# Patient Record
Sex: Female | Born: 1945
Health system: Southern US, Community
[De-identification: ages and names within clinical notes are randomized; demographics above are authoritative.]

## PROBLEM LIST (undated history)

## (undated) DIAGNOSIS — M359 Systemic involvement of connective tissue, unspecified: Secondary | ICD-10-CM

## (undated) DIAGNOSIS — M810 Age-related osteoporosis without current pathological fracture: Secondary | ICD-10-CM

## (undated) DIAGNOSIS — M1990 Unspecified osteoarthritis, unspecified site: Secondary | ICD-10-CM

## (undated) DIAGNOSIS — M7580 Other shoulder lesions, unspecified shoulder: Secondary | ICD-10-CM

## (undated) DIAGNOSIS — M419 Scoliosis, unspecified: Secondary | ICD-10-CM

## (undated) DIAGNOSIS — G8929 Other chronic pain: Secondary | ICD-10-CM

## (undated) DIAGNOSIS — M719 Bursopathy, unspecified: Secondary | ICD-10-CM

## (undated) DIAGNOSIS — M3500 Sicca syndrome, unspecified: Secondary | ICD-10-CM

## (undated) DIAGNOSIS — G35 Multiple sclerosis: Secondary | ICD-10-CM

## (undated) DIAGNOSIS — M329 Systemic lupus erythematosus, unspecified: Secondary | ICD-10-CM

## (undated) DIAGNOSIS — E042 Nontoxic multinodular goiter: Secondary | ICD-10-CM

## (undated) DIAGNOSIS — E7800 Pure hypercholesterolemia, unspecified: Secondary | ICD-10-CM

## (undated) DIAGNOSIS — S83209A Unspecified tear of unspecified meniscus, current injury, unspecified knee, initial encounter: Secondary | ICD-10-CM

## (undated) DIAGNOSIS — R112 Nausea with vomiting, unspecified: Secondary | ICD-10-CM

## (undated) DIAGNOSIS — IMO0002 Reserved for concepts with insufficient information to code with codable children: Secondary | ICD-10-CM

## (undated) DIAGNOSIS — I10 Essential (primary) hypertension: Secondary | ICD-10-CM

## (undated) DIAGNOSIS — C4490 Unspecified malignant neoplasm of skin, unspecified: Secondary | ICD-10-CM

## (undated) DIAGNOSIS — Z9889 Other specified postprocedural states: Secondary | ICD-10-CM

## (undated) HISTORY — DX: Unspecified osteoarthritis, unspecified site: M19.90

## (undated) HISTORY — DX: Sicca syndrome, unspecified: M35.00

## (undated) HISTORY — PX: TONSILLECTOMY: SUR1361

## (undated) HISTORY — DX: Systemic involvement of connective tissue, unspecified: M35.9

## (undated) HISTORY — DX: Age-related osteoporosis without current pathological fracture: M81.0

## (undated) HISTORY — DX: Pure hypercholesterolemia, unspecified: E78.00

## (undated) HISTORY — DX: Other chronic pain: G89.29

## (undated) HISTORY — DX: Essential (primary) hypertension: I10

## (undated) HISTORY — PX: DILATION AND CURETTAGE OF UTERUS: SHX78

## (undated) HISTORY — DX: Unspecified tear of unspecified meniscus, current injury, unspecified knee, initial encounter: S83.209A

## (undated) HISTORY — PX: OTHER SURGICAL HISTORY: SHX169

## (undated) HISTORY — DX: Scoliosis, unspecified: M41.9

## (undated) HISTORY — PX: SKIN CANCER EXCISION: SHX779

---

## 1999-03-30 ENCOUNTER — Ambulatory Visit (HOSPITAL_COMMUNITY): Admission: RE | Admit: 1999-03-30 | Discharge: 1999-03-30 | Payer: Self-pay | Admitting: Neurology

## 1999-07-17 ENCOUNTER — Other Ambulatory Visit: Admission: RE | Admit: 1999-07-17 | Discharge: 1999-07-17 | Payer: Self-pay | Admitting: Obstetrics and Gynecology

## 2000-01-12 ENCOUNTER — Encounter (INDEPENDENT_AMBULATORY_CARE_PROVIDER_SITE_OTHER): Payer: Self-pay

## 2000-01-12 ENCOUNTER — Ambulatory Visit (HOSPITAL_COMMUNITY): Admission: RE | Admit: 2000-01-12 | Discharge: 2000-01-12 | Payer: Self-pay | Admitting: Obstetrics & Gynecology

## 2000-03-07 ENCOUNTER — Encounter: Payer: Self-pay | Admitting: Obstetrics and Gynecology

## 2000-03-07 ENCOUNTER — Ambulatory Visit (HOSPITAL_COMMUNITY): Admission: RE | Admit: 2000-03-07 | Discharge: 2000-03-07 | Payer: Self-pay | Admitting: Obstetrics and Gynecology

## 2001-03-24 ENCOUNTER — Other Ambulatory Visit: Admission: RE | Admit: 2001-03-24 | Discharge: 2001-03-24 | Payer: Self-pay | Admitting: Obstetrics and Gynecology

## 2002-07-02 ENCOUNTER — Other Ambulatory Visit: Admission: RE | Admit: 2002-07-02 | Discharge: 2002-07-02 | Payer: Self-pay | Admitting: Obstetrics and Gynecology

## 2002-07-06 ENCOUNTER — Encounter: Admission: RE | Admit: 2002-07-06 | Discharge: 2002-07-06 | Payer: Self-pay | Admitting: Obstetrics and Gynecology

## 2002-07-06 ENCOUNTER — Encounter: Payer: Self-pay | Admitting: Obstetrics and Gynecology

## 2002-07-20 ENCOUNTER — Ambulatory Visit (HOSPITAL_COMMUNITY): Admission: RE | Admit: 2002-07-20 | Discharge: 2002-07-20 | Payer: Self-pay | Admitting: Neurology

## 2002-07-20 ENCOUNTER — Encounter: Payer: Self-pay | Admitting: Neurology

## 2002-07-22 ENCOUNTER — Ambulatory Visit (HOSPITAL_COMMUNITY): Admission: RE | Admit: 2002-07-22 | Discharge: 2002-07-22 | Payer: Self-pay | Admitting: Neurology

## 2002-07-22 ENCOUNTER — Encounter: Payer: Self-pay | Admitting: Neurology

## 2003-01-25 ENCOUNTER — Ambulatory Visit (HOSPITAL_COMMUNITY): Admission: RE | Admit: 2003-01-25 | Discharge: 2003-01-25 | Payer: Self-pay | Admitting: Gastroenterology

## 2003-10-20 ENCOUNTER — Other Ambulatory Visit: Admission: RE | Admit: 2003-10-20 | Discharge: 2003-10-20 | Payer: Self-pay | Admitting: Obstetrics and Gynecology

## 2004-11-07 ENCOUNTER — Other Ambulatory Visit: Admission: RE | Admit: 2004-11-07 | Discharge: 2004-11-07 | Payer: Self-pay | Admitting: Obstetrics and Gynecology

## 2005-12-20 ENCOUNTER — Other Ambulatory Visit: Admission: RE | Admit: 2005-12-20 | Discharge: 2005-12-20 | Payer: Self-pay | Admitting: Obstetrics and Gynecology

## 2008-02-23 ENCOUNTER — Emergency Department (HOSPITAL_COMMUNITY): Admission: EM | Admit: 2008-02-23 | Discharge: 2008-02-23 | Payer: Self-pay | Admitting: Family Medicine

## 2008-04-09 ENCOUNTER — Emergency Department (HOSPITAL_COMMUNITY): Admission: EM | Admit: 2008-04-09 | Discharge: 2008-04-10 | Payer: Self-pay | Admitting: Emergency Medicine
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
## 2009-09-30 ENCOUNTER — Encounter: Admission: RE | Admit: 2009-09-30 | Discharge: 2009-09-30 | Payer: Self-pay | Admitting: Family Medicine

## 2010-12-17 ENCOUNTER — Encounter: Payer: Self-pay | Admitting: Family Medicine

## 2011-04-13 NOTE — Op Note (Signed)
[facility]  Patient:    Michele Walton, Michele Walton                     MRN: [id number] Proc. Date: 01/12/00 Adm. Date:  [id number] Disc. Date: [id number] Attending:  Lars Pinks                           Operative Report  PREOPERATIVE DIAGNOSIS:       Postmenopausal bleeding on estrogen-replacement therapy.  POSTOPERATIVE DIAGNOSIS:      Postmenopausal bleeding on estrogen-replacement therapy.  PROCEDURE:                    1. Hysteroscopy.                               2. Dilatation and curettage.  SURGEON:                      Mark E. Dareen Piano, M.D.  ANESTHESIA:                   MAC with paracervical block.  DRAINS:                       Red rubber catheter to bladder.  ANTIBIOTICS:                  Ancef 1 g.  COMPLICATIONS:                None.  SPECIMENS:                    Endometrial and endocervical curettings, sent to pathology.  DESCRIPTION OF PROCEDURE:     Patient was taken to the operating room where she was placed in the dorsal supine position and MAC was administered.  She was placed n a dorsal lithotomy position and prepped with Hibiclens.  Her bladder was draped with a red rubber catheter.  A sterile speculum was placed in the vagina.  Twenty cc of 1% lidocaine were used for paracervical block.  Single-tooth tenaculum was applied to the anterior cervical lip.  The uterus was then sounded to 7 cm.  The cervical os was then dilated to a 27-French.  The hysteroscope was then advanced.  The endocervical canal appeared to be normal.  There was no evidence of any polyps r lesions.  On entering the uterine cavity, both ostia were visualized with ease.  There was no evidence of leiomyomata, polyps or lesions.  At this point, the scope was removed.  Endocervical curettage was performed, followed by an endometrial curettage.  Patient tolerated the procedure well.  She was taken to the recovery room in stable condition.   Instrument and lap counts were correct x 1. DD:  01/12/00 TD:  01/14/00 Job: [id number]

## 2011-04-13 NOTE — Op Note (Signed)
NAME:  Michele Walton, Michele Walton                        ACCOUNT NO.:  [id number]   MEDICAL RECORD NO.:  [id number]                   PATIENT TYPE:  AMB   LOCATION:  ENDO                                 FACILITY:  [facility]   PHYSICIAN:  Anselmo Rod, M.D.               DATE OF BIRTH:  1946/06/12   DATE OF PROCEDURE:  01/25/2003  DATE OF DISCHARGE:                                 OPERATIVE REPORT   PROCEDURE PERFORMED:  Screening colonoscopy.   ENDOSCOPIST:  Charna Elizabeth, M.D.   INSTRUMENT USED:  Olympus video colonoscope.   INDICATIONS FOR PROCEDURE:  The patient is a 65 year old white female  undergoing screening colonoscopy.  The patient has a history of abdominal  pain and chronic constipation.  Rule out colonic polyps, masses, etc.   PREPROCEDURE PREPARATION:  Informed consent was procured from the patient.  The patient was fasted for eight hours prior to the procedure and prepped  with a bottle of MiraLax and Gatorade the night prior to the procedure.   PREPROCEDURE PHYSICAL:  The patient had stable vital signs.  Neck supple.  Chest clear to auscultation.  S1 and S2 regular.  Abdomen soft with normal  bowel sounds.   DESCRIPTION OF PROCEDURE:  The patient was placed in left lateral decubitus  position and sedated with 50 mg of Demerol and 5 mg of Versed intravenously.  Once the patient was adequately sedated and maintained on low flow oxygen  and continuous cardiac monitoring, the Olympus video colonoscope was  advanced from the rectum to the cecum and terminal ileum without difficulty.  No masses, polyps, erosions, ulcerations or diverticula were seen.  Small  internal hemorrhoids were recognized on retroflexion in the rectum.  The  terminal ileum appeared healthy without lesions.   IMPRESSION:  1. Normal colonoscopy up to the terminal ileum except for small internal     hemorrhoids.  2. No masses polyps seen, no evidence of diverticulosis.   RECOMMENDATIONS:  1. A high fiber  diet with liberal fluid intake has been advocated.  20-25     grams of fiber has been recommended in her diet.  2.     Repeat colorectal cancer screening recommended in the next five to 10 years      unless the patient develops any abnormal symptoms in the interim.  3. Outpatient follow-up in the next two weeks, earlier if need be.                                                   Anselmo Rod, M.D.    JNM/MEDQ  D:  01/25/2003  T:  01/25/2003  Job:  [id number]   cc:   Duwayne Heck L. Mahaffey, M.D.  [address]  [place]  [place] [address]  Fax:  [phone]  

## 2011-08-03 ENCOUNTER — Other Ambulatory Visit: Payer: Self-pay | Admitting: Orthopedic Surgery

## 2011-08-03 DIAGNOSIS — Z780 Asymptomatic menopausal state: Secondary | ICD-10-CM

## 2011-08-07 ENCOUNTER — Other Ambulatory Visit: Payer: Self-pay | Admitting: Family Medicine

## 2011-08-07 DIAGNOSIS — E041 Nontoxic single thyroid nodule: Secondary | ICD-10-CM

## 2011-08-09 ENCOUNTER — Ambulatory Visit
Admission: RE | Admit: 2011-08-09 | Discharge: 2011-08-09 | Disposition: A | Discharge status: Home / Self Care | Payer: Medicare Other | Source: Ambulatory Visit | Attending: Orthopedic Surgery | Admitting: Orthopedic Surgery

## 2011-08-09 DIAGNOSIS — Z780 Asymptomatic menopausal state: Secondary | ICD-10-CM

## 2011-08-10 ENCOUNTER — Ambulatory Visit
Admission: RE | Admit: 2011-08-10 | Discharge: 2011-08-10 | Disposition: A | Discharge status: Home / Self Care | Payer: Medicare Other | Source: Ambulatory Visit | Attending: Family Medicine | Admitting: Family Medicine

## 2011-08-10 DIAGNOSIS — E041 Nontoxic single thyroid nodule: Secondary | ICD-10-CM

## 2011-12-03 DIAGNOSIS — Z79899 Other long term (current) drug therapy: Secondary | ICD-10-CM | POA: Diagnosis not present

## 2011-12-03 DIAGNOSIS — E7800 Pure hypercholesterolemia, unspecified: Secondary | ICD-10-CM | POA: Diagnosis not present

## 2011-12-03 DIAGNOSIS — I10 Essential (primary) hypertension: Secondary | ICD-10-CM | POA: Diagnosis not present

## 2012-02-01 DIAGNOSIS — N3941 Urge incontinence: Secondary | ICD-10-CM | POA: Diagnosis not present

## 2012-02-01 DIAGNOSIS — N318 Other neuromuscular dysfunction of bladder: Secondary | ICD-10-CM | POA: Diagnosis not present

## 2012-02-01 DIAGNOSIS — R3915 Urgency of urination: Secondary | ICD-10-CM | POA: Diagnosis not present

## 2012-03-18 DIAGNOSIS — L080 Pyoderma: Secondary | ICD-10-CM | POA: Diagnosis not present

## 2012-03-18 DIAGNOSIS — B958 Unspecified staphylococcus as the cause of diseases classified elsewhere: Secondary | ICD-10-CM | POA: Diagnosis not present

## 2012-04-02 DIAGNOSIS — Z124 Encounter for screening for malignant neoplasm of cervix: Secondary | ICD-10-CM | POA: Diagnosis not present

## 2012-04-02 DIAGNOSIS — Z1231 Encounter for screening mammogram for malignant neoplasm of breast: Secondary | ICD-10-CM | POA: Diagnosis not present

## 2012-04-16 DIAGNOSIS — G35 Multiple sclerosis: Secondary | ICD-10-CM | POA: Diagnosis not present

## 2012-04-16 DIAGNOSIS — H4720 Unspecified optic atrophy: Secondary | ICD-10-CM | POA: Diagnosis not present

## 2012-06-02 DIAGNOSIS — G35 Multiple sclerosis: Secondary | ICD-10-CM | POA: Diagnosis not present

## 2012-06-02 DIAGNOSIS — M549 Dorsalgia, unspecified: Secondary | ICD-10-CM | POA: Diagnosis not present

## 2012-06-04 DIAGNOSIS — I10 Essential (primary) hypertension: Secondary | ICD-10-CM | POA: Diagnosis not present

## 2012-06-04 DIAGNOSIS — Z79899 Other long term (current) drug therapy: Secondary | ICD-10-CM | POA: Diagnosis not present

## 2012-06-04 DIAGNOSIS — E7800 Pure hypercholesterolemia, unspecified: Secondary | ICD-10-CM | POA: Diagnosis not present

## 2012-06-04 DIAGNOSIS — Z119 Encounter for screening for infectious and parasitic diseases, unspecified: Secondary | ICD-10-CM | POA: Diagnosis not present

## 2012-07-22 DIAGNOSIS — L570 Actinic keratosis: Secondary | ICD-10-CM | POA: Diagnosis not present

## 2012-07-22 DIAGNOSIS — L819 Disorder of pigmentation, unspecified: Secondary | ICD-10-CM | POA: Diagnosis not present

## 2012-07-22 DIAGNOSIS — L930 Discoid lupus erythematosus: Secondary | ICD-10-CM | POA: Diagnosis not present

## 2012-07-22 DIAGNOSIS — D485 Neoplasm of uncertain behavior of skin: Secondary | ICD-10-CM | POA: Diagnosis not present

## 2012-08-07 DIAGNOSIS — IMO0002 Reserved for concepts with insufficient information to code with codable children: Secondary | ICD-10-CM | POA: Diagnosis not present

## 2012-08-13 DIAGNOSIS — IMO0002 Reserved for concepts with insufficient information to code with codable children: Secondary | ICD-10-CM | POA: Diagnosis not present

## 2012-08-19 DIAGNOSIS — L989 Disorder of the skin and subcutaneous tissue, unspecified: Secondary | ICD-10-CM | POA: Insufficient documentation

## 2012-08-20 DIAGNOSIS — IMO0002 Reserved for concepts with insufficient information to code with codable children: Secondary | ICD-10-CM | POA: Diagnosis not present

## 2012-08-20 DIAGNOSIS — M1710 Unilateral primary osteoarthritis, unspecified knee: Secondary | ICD-10-CM | POA: Diagnosis not present

## 2012-09-02 DIAGNOSIS — E042 Nontoxic multinodular goiter: Secondary | ICD-10-CM | POA: Diagnosis not present

## 2012-09-04 DIAGNOSIS — E042 Nontoxic multinodular goiter: Secondary | ICD-10-CM | POA: Diagnosis not present

## 2012-11-26 DIAGNOSIS — S83209A Unspecified tear of unspecified meniscus, current injury, unspecified knee, initial encounter: Secondary | ICD-10-CM

## 2012-11-26 HISTORY — DX: Unspecified tear of unspecified meniscus, current injury, unspecified knee, initial encounter: S83.209A

## 2012-12-08 DIAGNOSIS — E7800 Pure hypercholesterolemia, unspecified: Secondary | ICD-10-CM | POA: Diagnosis not present

## 2012-12-08 DIAGNOSIS — I10 Essential (primary) hypertension: Secondary | ICD-10-CM | POA: Diagnosis not present

## 2012-12-08 DIAGNOSIS — Z79899 Other long term (current) drug therapy: Secondary | ICD-10-CM | POA: Diagnosis not present

## 2012-12-10 DIAGNOSIS — IMO0002 Reserved for concepts with insufficient information to code with codable children: Secondary | ICD-10-CM | POA: Diagnosis not present

## 2012-12-10 DIAGNOSIS — M2390 Unspecified internal derangement of unspecified knee: Secondary | ICD-10-CM | POA: Diagnosis not present

## 2012-12-10 DIAGNOSIS — M2240 Chondromalacia patellae, unspecified knee: Secondary | ICD-10-CM | POA: Diagnosis not present

## 2012-12-10 DIAGNOSIS — Y999 Unspecified external cause status: Secondary | ICD-10-CM | POA: Diagnosis not present

## 2012-12-10 DIAGNOSIS — S83289A Other tear of lateral meniscus, current injury, unspecified knee, initial encounter: Secondary | ICD-10-CM | POA: Diagnosis not present

## 2012-12-10 DIAGNOSIS — X58XXXA Exposure to other specified factors, initial encounter: Secondary | ICD-10-CM | POA: Diagnosis not present

## 2012-12-10 DIAGNOSIS — Y929 Unspecified place or not applicable: Secondary | ICD-10-CM | POA: Diagnosis not present

## 2012-12-10 DIAGNOSIS — Y939 Activity, unspecified: Secondary | ICD-10-CM | POA: Diagnosis not present

## 2012-12-26 DIAGNOSIS — L930 Discoid lupus erythematosus: Secondary | ICD-10-CM | POA: Diagnosis not present

## 2013-01-20 DIAGNOSIS — Z79899 Other long term (current) drug therapy: Secondary | ICD-10-CM | POA: Diagnosis not present

## 2013-01-20 DIAGNOSIS — L930 Discoid lupus erythematosus: Secondary | ICD-10-CM | POA: Diagnosis not present

## 2013-01-21 DIAGNOSIS — Z4889 Encounter for other specified surgical aftercare: Secondary | ICD-10-CM | POA: Diagnosis not present

## 2013-03-25 ENCOUNTER — Encounter (HOSPITAL_COMMUNITY): Payer: Self-pay

## 2013-03-25 ENCOUNTER — Emergency Department (INDEPENDENT_AMBULATORY_CARE_PROVIDER_SITE_OTHER)
Admission: EM | Admit: 2013-03-25 | Discharge: 2013-03-25 | Disposition: A | Discharge status: Home / Self Care | Payer: Medicare Other | Source: Home / Self Care | Attending: Emergency Medicine | Admitting: Emergency Medicine

## 2013-03-25 DIAGNOSIS — S0990XA Unspecified injury of head, initial encounter: Secondary | ICD-10-CM

## 2013-03-25 DIAGNOSIS — G35 Multiple sclerosis: Secondary | ICD-10-CM | POA: Insufficient documentation

## 2013-03-25 DIAGNOSIS — R58 Hemorrhage, not elsewhere classified: Secondary | ICD-10-CM

## 2013-03-25 DIAGNOSIS — I998 Other disorder of circulatory system: Secondary | ICD-10-CM | POA: Diagnosis not present

## 2013-03-25 HISTORY — DX: Other shoulder lesions, unspecified shoulder: M75.80

## 2013-03-25 HISTORY — DX: Systemic lupus erythematosus, unspecified: M32.9

## 2013-03-25 HISTORY — DX: Unspecified malignant neoplasm of skin, unspecified: C44.90

## 2013-03-25 HISTORY — DX: Multiple sclerosis: G35

## 2013-03-25 HISTORY — DX: Nontoxic multinodular goiter: E04.2

## 2013-03-25 HISTORY — DX: Bursopathy, unspecified: M71.9

## 2013-03-25 HISTORY — DX: Reserved for concepts with insufficient information to code with codable children: IMO0002

## 2013-03-25 HISTORY — DX: Unspecified osteoarthritis, unspecified site: M19.90

## 2013-03-25 NOTE — ED Provider Notes (Signed)
History     CSN: [id number]  Arrival date & time 03/25/13  1412   First MD Initiated Contact with Patient 03/25/13 1622      Chief Complaint  Patient presents with  . Head Injury    (Consider location/radiation/quality/duration/timing/severity/associated sxs/prior treatment) HPI Comments: Pt had surgery on L knee in January, sometimes knee is still weak and gives out on her; her orthopedist is aware.  Knee gave out on her yesterday, pt fell forward into corner of filing cabinet.  Initially had large "goose egg" on her forehead which flattened out, then pt noticed yesterday evening she was getting bruising on forehead and around eyes.  Forehead feels "tight and weird" otherwise pt not in pain.   Patient is a 67 y.o. female presenting with head injury. The history is provided by the patient.  Head Injury Location:  R parietal Time since incident:  24 days Mechanism of injury comment:  Knee gave out on her and she fell forward into corner of filing cabinet Pain details:    Quality:  Aching   Severity:  Mild Chronicity:  New Relieved by:  None tried Worsened by:  Nothing tried Ineffective treatments:  None tried Associated symptoms: no disorientation, no headaches, no loss of consciousness, no memory loss, no nausea, no neck pain and no vomiting     Past Medical History  Diagnosis Date  . Multiple sclerosis   . Degenerative joint disease   . Bursitis   . Osteoarthritis   . AC (acromioclavicular) joint bone spurs   . Lupus   . Goiter, nontoxic, multinodular   . Skin cancer     Past Surgical History  Procedure Laterality Date  . Knee surgery      History reviewed. No pertinent family history.  History  Substance Use Topics  . Smoking status: Never Smoker   . Smokeless tobacco: Not on file  . Alcohol Use: No    OB History   Grav Para Term Preterm Abortions TAB SAB Ect Mult Living                  Review of Systems  Constitutional: Negative for fatigue.    HENT: Negative for neck pain.   Gastrointestinal: Negative for nausea and vomiting.  Skin: Positive for color change.  Neurological: Negative for dizziness, loss of consciousness and headaches.  Psychiatric/Behavioral: Negative for memory loss and confusion.    Allergies  Sulfa antibiotics  Home Medications   Current Outpatient Rx  Name  Route  Sig  Dispense  Refill  . benazepril-hydrochlorthiazide (LOTENSIN HCT) 20-25 MG per tablet   Oral   Take 1 tablet by mouth daily.         Marland Kitchen gabapentin (NEURONTIN) 300 MG capsule   Oral   Take 300 mg by mouth 3 (three) times daily.         . hydroxychloroquine (PLAQUENIL) 200 MG tablet   Oral   Take by mouth daily.         . modafinil (PROVIGIL) 200 MG tablet   Oral   Take 200 mg by mouth daily.         . naproxen (NAPROSYN) 500 MG tablet   Oral   Take 500 mg by mouth 2 (two) times daily with a meal.           BP 130/77  Pulse 82  Temp(Src) 98.1 F (36.7 C) (Oral)  Resp 16  SpO2 99%  Physical Exam  Constitutional: She is oriented to person,  place, and time. She appears well-developed and well-nourished. No distress.  HENT:  Head: Normocephalic.    Right Ear: Tympanic membrane, external ear and ear canal normal.  Left Ear: Tympanic membrane, external ear and ear canal normal.  Eyes: EOM are normal. Pupils are equal, round, and reactive to light.  Neurological: She is alert and oriented to person, place, and time. Gait normal.  Skin: Skin is warm and intact. Ecchymosis noted.    ED Course  Procedures (including critical care time)  Labs Reviewed - No data to display No results found.   1. Minor head injury without loss of consciousness, initial encounter   2. Ecchymosis       MDM  Pt given concerning signs to watch for.          Cathlyn Parsons, NP 03/25/13 [phone]

## 2013-03-25 NOTE — ED Provider Notes (Signed)
Medical screening examination/treatment/ procedure(s) were performed by non-physician practitioner and as supervising physician I was immediately available for consultations/colaborattion.   [employer] [place],M.D.  Duwayne Heck de [place], MD 03/25/13 2107

## 2013-03-25 NOTE — ED Notes (Signed)
States yesterday, her knee gave out, caused her to fall, struck top of head on corner of file cabinet. Denies LOC. Had developed a knot on top of her head, and face felt tight. Noted bruising and swelling in her face , right > left. No nose bleed, no Battle sign either ear. Good EOM

## 2013-04-02 DIAGNOSIS — Z79899 Other long term (current) drug therapy: Secondary | ICD-10-CM | POA: Diagnosis not present

## 2013-04-02 DIAGNOSIS — L930 Discoid lupus erythematosus: Secondary | ICD-10-CM | POA: Diagnosis not present

## 2013-04-06 DIAGNOSIS — H538 Other visual disturbances: Secondary | ICD-10-CM | POA: Diagnosis not present

## 2013-04-06 DIAGNOSIS — H353190 Nonexudative age-related macular degeneration, unspecified eye, stage unspecified: Secondary | ICD-10-CM | POA: Diagnosis not present

## 2013-04-06 DIAGNOSIS — Z79899 Other long term (current) drug therapy: Secondary | ICD-10-CM | POA: Diagnosis not present

## 2013-04-06 DIAGNOSIS — H2510 Age-related nuclear cataract, unspecified eye: Secondary | ICD-10-CM | POA: Diagnosis not present

## 2013-04-15 DIAGNOSIS — Z79899 Other long term (current) drug therapy: Secondary | ICD-10-CM | POA: Diagnosis not present

## 2013-05-08 DIAGNOSIS — H04129 Dry eye syndrome of unspecified lacrimal gland: Secondary | ICD-10-CM | POA: Diagnosis not present

## 2013-05-25 ENCOUNTER — Telehealth: Payer: Self-pay | Admitting: Neurology

## 2013-05-25 MED ORDER — GABAPENTIN 300 MG PO CAPS: 300.0000 mg | ORAL_CAPSULE | Freq: Three times a day (TID) | ORAL | Status: DC

## 2013-05-25 NOTE — Telephone Encounter (Signed)
Former [facility] patient, has not been assigned new provider.  Auth refill via WID.  Notified Triage patient needs assignment and then patient will need to schedule appt.

## 2013-06-11 ENCOUNTER — Telehealth: Payer: Self-pay | Admitting: Neurology

## 2013-06-11 NOTE — Telephone Encounter (Signed)
Pt is needing her prescription refilled but states no one has assigned a Dr for her so that she can get this prescription. Pt would like for someone to call her and give her this information and get her apt  W/new assigned Dr. Lynford Humphrey

## 2013-06-12 DIAGNOSIS — M169 Osteoarthritis of hip, unspecified: Secondary | ICD-10-CM | POA: Diagnosis not present

## 2013-06-12 DIAGNOSIS — Z4889 Encounter for other specified surgical aftercare: Secondary | ICD-10-CM | POA: Diagnosis not present

## 2013-06-12 DIAGNOSIS — M25569 Pain in unspecified knee: Secondary | ICD-10-CM | POA: Diagnosis not present

## 2013-06-12 NOTE — Telephone Encounter (Signed)
I called pt and LMVM for her on home #, cell could not LM.  (VM not set up).  Appt with Dr. Marjory Lies 07-31-2013 at 0830.  Once confirmed then can call prescription for gabapentin.

## 2013-06-16 ENCOUNTER — Encounter: Payer: Self-pay | Admitting: Neurology

## 2013-06-16 ENCOUNTER — Ambulatory Visit (INDEPENDENT_AMBULATORY_CARE_PROVIDER_SITE_OTHER): Payer: Medicare Other | Admitting: Neurology

## 2013-06-16 ENCOUNTER — Telehealth: Payer: Self-pay | Admitting: *Deleted

## 2013-06-16 VITALS — BP 125/72 | HR 76 | Resp 18 | Ht 63.0 in | Wt 161.0 lb

## 2013-06-16 DIAGNOSIS — M4120 Other idiopathic scoliosis, site unspecified: Secondary | ICD-10-CM

## 2013-06-16 DIAGNOSIS — G35 Multiple sclerosis: Secondary | ICD-10-CM

## 2013-06-16 DIAGNOSIS — G35D Multiple sclerosis, unspecified: Secondary | ICD-10-CM

## 2013-06-16 MED ORDER — MODAFINIL 200 MG PO TABS: 200.0000 mg | ORAL_TABLET | Freq: Every day | ORAL | Status: DC

## 2013-06-16 MED ORDER — NAPROXEN 500 MG PO TABS: 500.0000 mg | ORAL_TABLET | Freq: Two times a day (BID) | ORAL | Status: DC

## 2013-06-16 MED ORDER — GABAPENTIN 300 MG PO CAPS: 300.0000 mg | ORAL_CAPSULE | Freq: Three times a day (TID) | ORAL | Status: DC

## 2013-06-16 NOTE — Progress Notes (Signed)
[facility]  Provider:  Dr Travus Oren Referring Provider: Hollice Espy, MD Primary Care Physician:  Hollice Espy, MD  Chief Complaint  Patient presents with  . Follow-up    formerly Dr Imagene Gurney patient, MS, meds refill, rm 10    HPI:  Michele Walton is a 67 y.o. female here as a revisit , yearly for presumed MS . The patient was treated and followed by Dr Sandria Manly. The patient is a 67 year old Caucasian, right-handed female. She had originally been evaluated by Dr. Maximino Greenland in November 1994 when she presented with sensory abnormalities in the thoracic 8 level on the left side as well as increased and more brisk deep tendon reflexes on the left. An MRI at that time showed plaques thought to be present and mass she saw other neurologists and had normal MRIs of the brain in followup she had a normal visual evoked potentials for the left eye but as he is as examination was negative for oligoclonal bands, the hallmark of multiple sclerosis. There was also a question of another autoimmune disease being involved since the patient's antinuclear antibody was positive at 1;80 was referred for rheumatology. She started on Plaquenil - She was prescribed  Betaseron by Dr. Kathrynn Ducking at [facility]- in 2000,  later  the same year Dr. Sandria Manly begun to follow her.   he  found that there were indeed some cord signals and he repeated this he is after testing which now documented two oligoclonal bands,  this is borderline at best . She was diagnosed with cutaneus lupus.    The patient never experienced any clinical relapsing and does raise the question of a primary progressive MS disease versus  another cause for her abnormal sensations and hyper reflexes . She also has seen her urologist for neurogenic bladder.  Brain MRIs over the year have remained normal,  she has never had in the sign she does not have cognitive deficits and normal on visual disturbance. There are is significant  degenerative change as described over her cervical spine and her upper thoracic spine there is some atrophy described also lower cord, which may represent an area and degeneration. Devics' disease or neuromyelitis optica antibody (NMO)  has been negative. She was referred to [facility] for her scoliosis  And was found to  present with a 37 curving,  she has not had a surgical procedure but it is very likely that her spinal degenerative disease contributes to her neurologic symptoms and signs. She continues to exercise and does water aerobic exercises.   She never had any visual optic nerve involvement or optic neuritis. The patient works as an Theatre stage manager.        Review of Systems: Out of a complete 14 system review, the patient complains of only the following symptoms, and all other reviewed systems are negative.  scoliosis,    History   Social History  . Marital Status: Single    Spouse Name: N/A    Number of Children: 2  . Years of Education: N/A   Occupational History  . plan administer     [employer]   Social History Main Topics  . Smoking status: Never Smoker   . Smokeless tobacco: Not on file  . Alcohol Use: No  . Drug Use: No  . Sexually Active: Not on file   Other Topics Concern  . Not on file   Social History Narrative  . No narrative on file    Family  History  Problem Relation Age of Onset  . Heart disease Mother   . Cancer Other     Past Medical History  Diagnosis Date  . Multiple sclerosis   . Degenerative joint disease   . Bursitis   . Osteoarthritis   . AC (acromioclavicular) joint bone spurs   . Lupus   . Goiter, nontoxic, multinodular   . Skin cancer   . Chronic pain   . High cholesterol   . Hypertension   . Scoliosis   . Arthritis   . Torn meniscus 1/14    left knee    Past Surgical History  Procedure Laterality Date  . Knee surgery    . Tonsillectomy    . Childbirth  [id number]    admissions     Current Outpatient Prescriptions  Medication Sig Dispense Refill  . benazepril-hydrochlorthiazide (LOTENSIN HCT) 20-25 MG per tablet Take 1 tablet by mouth daily.      . calcium carbonate 200 MG capsule Take 250 mg by mouth daily.      . Cyanocobalamin (VITAMIN B 12 PO) Take by mouth daily.      . fesoterodine (TOVIAZ) 4 MG TB24 Take 4 mg by mouth daily.      Marland Kitchen gabapentin (NEURONTIN) 300 MG capsule Take 1 capsule (300 mg total) by mouth 3 (three) times daily.  90 capsule  0  . hydroxychloroquine (PLAQUENIL) 200 MG tablet Take by mouth daily.      . modafinil (PROVIGIL) 200 MG tablet Take 200 mg by mouth daily.      . naproxen (NAPROSYN) 500 MG tablet Take 500 mg by mouth 2 (two) times daily with a meal.      . VITAMIN D, ERGOCALCIFEROL, PO Take by mouth daily.      . Vitamin Mixture (VITAMIN E COMPLETE PO) Take by mouth daily.       No current facility-administered medications for this visit.    Allergies as of 06/16/2013 - Review Complete 06/16/2013  Allergen Reaction Noted  . Latex  06/16/2013  . Percocet (oxycodone-acetaminophen)  06/16/2013  . Sulfa antibiotics  03/25/2013    Vitals: BP 125/72  Pulse 76  Resp 18  Ht 5\' 3"  (1.6 m)  Wt 161 lb (73.029 kg)  BMI 28.53 kg/m2 Last Weight:  Wt Readings from Last 1 Encounters:  06/16/13 161 lb (73.029 kg)   Last Height:   Ht Readings from Last 1 Encounters:  06/16/13 5\' 3"  (1.6 m)     Physical exam:  General: The patient is awake, alert and appears not in acute distress. The patient is well groomed. Head: Normocephalic, atraumatic. Neck is supple. Mallampati 3 , neck circumference: 14.5  Cardiovascular:  Regular rate and rhythm, without  murmurs or carotid bruit, and without distended neck veins. Respiratory: Lungs are clear to auscultation. Skin:  Without evidence of edema, has  A faint  Rash on her arms and hands.  Wears long sleeves.  Trunk: BMI is elevated and patient  has  A scolisois impaired posture when sitting  or standing , leaning to the left.   Neurologic exam : The patient is awake and alert, oriented to place and time.  Memory subjective  described as intact. There is a normal attention span & concentration ability. Speech is fluent without dysarthria, dysphonia or aphasia. Mood and affect are appropriate.  Cranial nerves: Pupils are equal and briskly reactive to light. Funduscopic exam without  evidence of pallor or edema. Extraocular movements  in vertical and horizontal planes  intact and without nystagmus. Visual fields by finger perimetry are intact. Hearing to finger rub intact.  Facial sensation intact to fine touch. Facial motor strength is symmetric and tongue and uvula move midline.  Motor exam:   Normal tone and normal muscle bulk and symmetric normal strength in all extremities.  Sensory:  Fine touch, pinprick and vibration were tested in all extremities. Vibration was less strong in the left foot ankle , but toes were all numb. She reports burning and pin and needle feeling in her feet.  Proprioception for the hands is normal.  Coordination: Rapid alternating movements in the fingers/hands is tested and normal. Finger-to-nose maneuver without evidence of ataxia, dysmetria or tremor.  Gait and station: Patient walks without assistive device .  She  Has a limp and walks  , being bend forward and to the left .  Strength within normal limits. Stance is stable and normal.  There is kyphosis and scoliosis , and possible paraspinal muscle atrophy.  Deep tendon reflexes: in the  upper and lower extremities are symmetric , and left sided very slightly brisker.  Babinski maneuver response is  downgoing.   Assessment:  After physical and neurologic examination, review of laboratory studies, imaging, neurophysiology testing and pre-existing records, assessment will be reviewed on the problem list.   I am unsure about MS in this patient , there is a positive ANA, and scoliosis attributed to  muscle weakness . High fall risk , had a fall last April . 9 points.  Remains on plaquenil.   Remains active and exercises.    Plan:  Treatment plan and additional workup will be reviewed under Problem List.  Refilled medications, she needs flu and pneumonia and zoster vacination.   Patient had positive Ab for Herpes zoster , needs the  vaccine.

## 2013-06-16 NOTE — Telephone Encounter (Signed)
Pt called and LMVM that would like to see Dr. Vickey Huger.  Also trying to get refill on naproxen, been out for one week.  [facility] [address].  Also stated that takes [facility] Neurotin.  Per Dr. Vickey Huger, pt can come in today at 1600 appt and be here at 1530.  Pt said to take and will call if cannot make it.

## 2013-06-17 DIAGNOSIS — M4716 Other spondylosis with myelopathy, lumbar region: Secondary | ICD-10-CM | POA: Diagnosis not present

## 2013-06-17 DIAGNOSIS — M5137 Other intervertebral disc degeneration, lumbosacral region: Secondary | ICD-10-CM | POA: Diagnosis not present

## 2013-06-17 DIAGNOSIS — M4120 Other idiopathic scoliosis, site unspecified: Secondary | ICD-10-CM | POA: Diagnosis not present

## 2013-06-17 DIAGNOSIS — IMO0002 Reserved for concepts with insufficient information to code with codable children: Secondary | ICD-10-CM | POA: Diagnosis not present

## 2013-06-18 ENCOUNTER — Other Ambulatory Visit: Payer: Self-pay | Admitting: Neurology

## 2013-07-22 ENCOUNTER — Other Ambulatory Visit (INDEPENDENT_AMBULATORY_CARE_PROVIDER_SITE_OTHER): Payer: Self-pay

## 2013-07-22 DIAGNOSIS — G35 Multiple sclerosis: Secondary | ICD-10-CM | POA: Diagnosis not present

## 2013-07-22 DIAGNOSIS — M4120 Other idiopathic scoliosis, site unspecified: Secondary | ICD-10-CM | POA: Diagnosis not present

## 2013-07-22 DIAGNOSIS — Z0289 Encounter for other administrative examinations: Secondary | ICD-10-CM

## 2013-07-23 DIAGNOSIS — I10 Essential (primary) hypertension: Secondary | ICD-10-CM | POA: Diagnosis not present

## 2013-07-23 DIAGNOSIS — Z79899 Other long term (current) drug therapy: Secondary | ICD-10-CM | POA: Diagnosis not present

## 2013-07-23 DIAGNOSIS — E7800 Pure hypercholesterolemia, unspecified: Secondary | ICD-10-CM | POA: Diagnosis not present

## 2013-07-23 DIAGNOSIS — Z2821 Immunization not carried out because of patient refusal: Secondary | ICD-10-CM | POA: Diagnosis not present

## 2013-07-23 DIAGNOSIS — Z1211 Encounter for screening for malignant neoplasm of colon: Secondary | ICD-10-CM | POA: Diagnosis not present

## 2013-07-23 LAB — ENA+DNA/DS+SJORGEN'S
ENA RNP Ab: 0.2 AI (ref 0.0–0.9)
ENA SSB (LA) Ab: >8 AI — ABNORMAL HIGH (ref 0.0–0.9)
dsDNA Ab: <1 IU/mL (ref 0–9)

## 2013-07-23 LAB — CK TOTAL AND CKMB (NOT AT ARMC): Total CK: 132 U/L (ref 24–173)

## 2013-07-23 LAB — ANA W/REFLEX: Anti Nuclear Antibody(ANA): POSITIVE — AB

## 2013-07-28 ENCOUNTER — Encounter: Payer: Self-pay | Admitting: Neurology

## 2013-07-28 DIAGNOSIS — R82998 Other abnormal findings in urine: Secondary | ICD-10-CM | POA: Diagnosis not present

## 2013-07-28 DIAGNOSIS — R339 Retention of urine, unspecified: Secondary | ICD-10-CM | POA: Diagnosis not present

## 2013-07-28 DIAGNOSIS — N3941 Urge incontinence: Secondary | ICD-10-CM | POA: Diagnosis not present

## 2013-07-28 DIAGNOSIS — R3915 Urgency of urination: Secondary | ICD-10-CM | POA: Diagnosis not present

## 2013-07-28 DIAGNOSIS — N318 Other neuromuscular dysfunction of bladder: Secondary | ICD-10-CM | POA: Diagnosis not present

## 2013-07-29 DIAGNOSIS — M1710 Unilateral primary osteoarthritis, unspecified knee: Secondary | ICD-10-CM | POA: Diagnosis not present

## 2013-07-30 DIAGNOSIS — M79609 Pain in unspecified limb: Secondary | ICD-10-CM | POA: Diagnosis not present

## 2013-07-30 DIAGNOSIS — M4120 Other idiopathic scoliosis, site unspecified: Secondary | ICD-10-CM | POA: Diagnosis not present

## 2013-07-31 ENCOUNTER — Ambulatory Visit: Payer: Self-pay | Admitting: Diagnostic Neuroimaging

## 2013-08-03 ENCOUNTER — Other Ambulatory Visit: Payer: Self-pay | Admitting: Orthopaedic Surgery

## 2013-08-03 DIAGNOSIS — M419 Scoliosis, unspecified: Secondary | ICD-10-CM

## 2013-08-03 DIAGNOSIS — M545 Low back pain: Secondary | ICD-10-CM

## 2013-08-04 ENCOUNTER — Encounter: Payer: Self-pay | Admitting: Neurology

## 2013-08-04 ENCOUNTER — Ambulatory Visit (INDEPENDENT_AMBULATORY_CARE_PROVIDER_SITE_OTHER): Payer: Medicare Other | Admitting: Neurology

## 2013-08-04 VITALS — BP 127/71 | HR 71 | Ht 63.0 in | Wt 160.0 lb

## 2013-08-04 DIAGNOSIS — D8989 Other specified disorders involving the immune mechanism, not elsewhere classified: Secondary | ICD-10-CM

## 2013-08-04 DIAGNOSIS — L930 Discoid lupus erythematosus: Secondary | ICD-10-CM | POA: Diagnosis not present

## 2013-08-04 DIAGNOSIS — M359 Systemic involvement of connective tissue, unspecified: Secondary | ICD-10-CM | POA: Diagnosis not present

## 2013-08-04 HISTORY — DX: Systemic involvement of connective tissue, unspecified: M35.9

## 2013-08-04 MED ORDER — PREDNISONE 10 MG PO TABS: 10.0000 mg | ORAL_TABLET | Freq: Every day | ORAL | Status: DC

## 2013-08-04 NOTE — Patient Instructions (Addendum)
Prednisone tablets What is this medicine? PREDNISONE (PRED ni sone) is a corticosteroid. It is commonly used to treat inflammation of the skin, joints, lungs, and other organs. Common conditions treated include asthma, allergies, and arthritis. It is also used for other conditions, such as blood disorders and diseases of the adrenal glands. This medicine may be used for other purposes; ask your health care provider or pharmacist if you have questions. What should I tell my health care provider before I take this medicine? They need to know if you have any of these conditions: -Cushing's syndrome -diabetes -glaucoma -heart disease -high blood pressure -infection (especially a virus infection such as chickenpox, cold sores, or herpes) -kidney disease -liver disease -mental illness -myasthenia gravis -osteoporosis -seizures -stomach or intestine problems -thyroid disease -an unusual or allergic reaction to lactose, prednisone, other medicines, foods, dyes, or preservatives -pregnant or trying to get pregnant -breast-feeding How should I use this medicine? Take this medicine by mouth with a Kieara Schwark of water. Follow the directions on the prescription label. Take this medicine with food. If you are taking this medicine once a day, take it in the morning. Do not take more medicine than you are told to take. Do not suddenly stop taking your medicine because you may develop a severe reaction. Your doctor will tell you how much medicine to take. If your doctor wants you to stop the medicine, the dose may be slowly lowered over time to avoid any side effects. Talk to your pediatrician regarding the use of this medicine in children. Special care may be needed. Overdosage: If you think you have taken too much of this medicine contact a poison control center or emergency room at once. NOTE: This medicine is only for you. Do not share this medicine with others. What if I miss a dose? If you miss a dose,  take it as soon as you can. If it is almost time for your next dose, talk to your doctor or health care professional. You may need to miss a dose or take an extra dose. Do not take double or extra doses without advice. What may interact with this medicine? Do not take this medicine with any of the following medications: -metyrapone -mifepristone This medicine may also interact with the following medications: -aminoglutethimide -amphotericin B -aspirin and aspirin-like medicines -barbiturates -certain medicines for diabetes, like glipizide or glyburide -cholestyramine -cholinesterase inhibitors -cyclosporine -digoxin -diuretics -ephedrine -female hormones, like estrogens and birth control pills -isoniazid -ketoconazole -NSAIDS, medicines for pain and inflammation, like ibuprofen or naproxen -phenytoin -rifampin -toxoids -vaccines -warfarin This list may not describe all possible interactions. Give your health care provider a list of all the medicines, herbs, non-prescription drugs, or dietary supplements you use. Also tell them if you smoke, drink alcohol, or use illegal drugs. Some items may interact with your medicine. What should I watch for while using this medicine? Visit your doctor or health care professional for regular checks on your progress. If you are taking this medicine over a prolonged period, carry an identification card with your name and address, the type and dose of your medicine, and your doctor's name and address. This medicine may increase your risk of getting an infection. Tell your doctor or health care professional if you are around anyone with measles or chickenpox, or if you develop sores or blisters that do not heal properly. If you are going to have surgery, tell your doctor or health care professional that you have taken this medicine within the last   twelve months. Ask your doctor or health care professional about your diet. You may need to lower the amount  of salt you eat. This medicine may affect blood sugar levels. If you have diabetes, check with your doctor or health care professional before you change your diet or the dose of your diabetic medicine. What side effects may I notice from receiving this medicine? Side effects that you should report to your doctor or health care professional as soon as possible: -allergic reactions like skin rash, itching or hives, swelling of the face, lips, or tongue -changes in emotions or moods -changes in vision -depressed mood -eye pain -fever or chills, cough, sore throat, pain or difficulty passing urine -increased thirst -swelling of ankles, feet Side effects that usually do not require medical attention (report to your doctor or health care professional if they continue or are bothersome): -confusion, excitement, restlessness -headache -nausea, vomiting -skin problems, acne, thin and shiny skin -trouble sleeping -weight gain This list may not describe all possible side effects. Call your doctor for medical advice about side effects. You may report side effects to FDA at 1-800-FDA-1088. Where should I keep my medicine? Keep out of the reach of children. Store at room temperature between 15 and 30 degrees C (59 and 86 degrees F). Protect from light. Keep container tightly closed. Throw away any unused medicine after the expiration date. NOTE: This sheet is a summary. It may not cover all possible information. If you have questions about this medicine, talk to your doctor, pharmacist, or health care provider.  2012, [employer]vier/Gold Standard. (06/28/2011 10:57:14 AM)

## 2013-08-04 NOTE — Progress Notes (Signed)
Dear Dr. Kevan Ny and Dr. Dierdre Forth ,   i am seeing today Michele Walton,  a former patient of my retired partner Dr. Roetta Sessions.   Mrs.  Michele Walton has been followed for multiple sclerosis in this office. She had been originally evaluated in November 1994 when she presented for sensory abnormalities  That were felt to  relate to the spinal cord level at the thoracic eighth vertebrae.  She presented at that time with hyperreflexia on the left. An MRI of the spinal cord showed what was thought to be demyelinating  Plaques.  She had normal MRIs of the brain in followup, normal  Evoked  visual potentials, and initially CSF  was negative for oligoclonal bands. There has always been a question of another autoimmune disease being involved since the patient tested positive for antinuclear antibodies. She was referred to rheumatology and started on Plaquenil. She followed also at [facility] from 2000 on this Dr. Quincy Simmonds will prescribed Betaseron. Later that year, she returned to [facility] and followed up with  Dr. Sandria Manly., who imaged the patient twice and  found that they were thoracic and upper cervical spinal cord signals (  in the cervical  C 2-5 and upper thoracic cord  Th 2 ) he also repeated the CSF tested and found now two oligoclonal bands.  The patient never experienced clinical relapsing remitting disorder , had never an optic neuritis and thus  a question of a primary progressive MS disease was also raised- because followup MRI of the brain the last in 2013 failed to document   demyelinating lesions again . An antibody for neuromyelitis optica ( NMO - Devic's)  was not found.  The patient has quite significant scoliosis of the spine and has presented with a 37  curving, she has no pain associated with the back right now . Her spinal degenerative disease and other joint diseases  can be attributed to her posture and abnormal shape of the spinal column.  She continues to exercise and does water  exercises.   She has noticed lower extremity progressive weakness. 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 As I  am less sure of dealing with multiple sclerosis in this patient,   I had ordered an immune disease panel which included an ANA., and  which again returned positive.  A total creatinine kinase was a normal range of 132 units per liter, but her CK-MB was elevated at 6.2.   Ds DNA test was negative , and Sjogren's DNA test returned positive for SSA   Ro and SSB -L A. Antibody at a more than 8 times the normal titer. Double-stranded DNA was negative.  I would like Dr. Dierdre Forth and Dr. Kevan Ny to evaluate,  if this patient may have another autoimmune disorder syndrome or a systemic form of Lupus and not just the cutaneus form of lupus.  She shows no signs of scleroderma , but I have not tested her for polymyositis or dermatomyositis.   I would like to add a steroid taper to Actonel to see if the patient's myalgia or and muscle atrophy would respond. I have also discussed with the patient when the best time for a trial like this would be. Since she is preparing to have hip surgery, I would like to give her at least a two-week prednisone treatment prior.  I hope that this will limit the stiffness ,  and helps her to recover quicker also couldn't tolerate physical therapy  and occupational therapy after surgery.  I have forwarded to the laboratory results to you by  [facility]. If you were unable to review results,  please contact me at [facility], .  Sincerely Dr. Melvyn Novas,  M.D.  PS-  Plan :  start prednisone for 14 days and PT , OT .  Today obtain  SM, RNP and Jo-1 .   Physical exam:  General: The patient is awake, alert and appears not in acute distress. The patient is well groomed.  Head: Normocephalic, atraumatic. Neck is supple. Mallampati 3 , neck circumference: 14.5  Cardiovascular: Regular rate and rhythm, without  murmurs or carotid bruit, and without distended neck veins.  Respiratory: Lungs are clear to auscultation.  Skin: Without evidence of edema- She has a faint reddish , non blistering  rash on her arms and hands, that she covers under  long sleeves.  Trunk: BMI is elevated. Scolisois impaired posture when sitting or standing , leaning to the left.  Neurologic exam :  The patient is awake and alert, oriented to place and time.  Memory subjective described as intact.  Speech is fluent  Mood and affect are appropriate.  Cranial nerves:  Pupils are equal and briskly reactive to light.  Extraocular movements in vertical and horizontal planes intact and without nystagmus. Visual fields by finger perimetry are intact.  Hearing to finger rub intact. Facial sensation intact to fine touch. Facial motor strength is symmetric and tongue and uvula move midline.  Motor exam: Normal tone , but strength of hip flexion and adduction was weaker. Her  grip is strong , flexion at elbow and extension and shoulder elevation were intact.   Sensory: Fine touch, pinprick and vibration were tested in all extremities.  Vibration was less strong in the left foot ankle , but all  toes were  numb. She reports burning sensation as well as  pin and needle feeling in her feet, neuropathic and sciatic symptoms by report.   Proprioception  is normal.  Coordination: Finger-to-nose maneuver without evidence of ataxia, tremor.  Gait and station: Patient walks without assistive device . She has a limp and walks  bend forward and to the left . She can rise form the seated position without my assistance , but needed to brace herself.   Stance is stable, but wider based- . There is kyphosis and scoliosis , and possible paraspinal muscle atrophy.  Deep tendon reflexes: in the  lower extremities are hyperreflxic and left sided slightly more brisk than right .  Babinski maneuver response is downgoing.

## 2013-08-05 ENCOUNTER — Telehealth: Payer: Self-pay

## 2013-08-05 NOTE — Telephone Encounter (Signed)
I called patient to respond to her message through my chart. I left VM, hold off on taking "that" medication until after blood work. Also please let us know when you will be having blood work done. Thank you.

## 2013-08-11 DIAGNOSIS — D509 Iron deficiency anemia, unspecified: Secondary | ICD-10-CM | POA: Diagnosis not present

## 2013-08-11 DIAGNOSIS — K5900 Constipation, unspecified: Secondary | ICD-10-CM | POA: Diagnosis not present

## 2013-08-11 DIAGNOSIS — Z1211 Encounter for screening for malignant neoplasm of colon: Secondary | ICD-10-CM | POA: Diagnosis not present

## 2013-08-11 DIAGNOSIS — R1319 Other dysphagia: Secondary | ICD-10-CM | POA: Diagnosis not present

## 2013-08-12 ENCOUNTER — Ambulatory Visit
Admission: RE | Admit: 2013-08-12 | Discharge: 2013-08-12 | Disposition: A | Discharge status: Home / Self Care | Payer: Medicare Other | Source: Ambulatory Visit | Attending: Orthopaedic Surgery | Admitting: Orthopaedic Surgery

## 2013-08-12 DIAGNOSIS — M545 Low back pain: Secondary | ICD-10-CM

## 2013-08-12 DIAGNOSIS — M5137 Other intervertebral disc degeneration, lumbosacral region: Secondary | ICD-10-CM | POA: Diagnosis not present

## 2013-08-12 DIAGNOSIS — M47817 Spondylosis without myelopathy or radiculopathy, lumbosacral region: Secondary | ICD-10-CM | POA: Diagnosis not present

## 2013-08-12 DIAGNOSIS — M419 Scoliosis, unspecified: Secondary | ICD-10-CM

## 2013-08-18 DIAGNOSIS — N390 Urinary tract infection, site not specified: Secondary | ICD-10-CM | POA: Diagnosis not present

## 2013-08-18 DIAGNOSIS — L930 Discoid lupus erythematosus: Secondary | ICD-10-CM | POA: Diagnosis not present

## 2013-08-18 DIAGNOSIS — Z79899 Other long term (current) drug therapy: Secondary | ICD-10-CM | POA: Diagnosis not present

## 2013-08-27 ENCOUNTER — Encounter (HOSPITAL_COMMUNITY): Payer: Self-pay | Admitting: Pharmacy Technician

## 2013-08-27 DIAGNOSIS — N390 Urinary tract infection, site not specified: Secondary | ICD-10-CM | POA: Diagnosis not present

## 2013-09-01 ENCOUNTER — Encounter (HOSPITAL_COMMUNITY)
Admission: RE | Admit: 2013-09-01 | Discharge: 2013-09-01 | Disposition: A | Discharge status: Home / Self Care | Payer: Medicare Other | Source: Ambulatory Visit | Attending: Orthopedic Surgery | Admitting: Orthopedic Surgery

## 2013-09-01 ENCOUNTER — Encounter (HOSPITAL_COMMUNITY): Payer: Self-pay

## 2013-09-01 ENCOUNTER — Ambulatory Visit (HOSPITAL_COMMUNITY)
Admission: RE | Admit: 2013-09-01 | Discharge: 2013-09-01 | Disposition: A | Discharge status: Home / Self Care | Payer: Medicare Other | Source: Ambulatory Visit | Attending: Orthopedic Surgery | Admitting: Orthopedic Surgery

## 2013-09-01 DIAGNOSIS — M169 Osteoarthritis of hip, unspecified: Secondary | ICD-10-CM | POA: Insufficient documentation

## 2013-09-01 DIAGNOSIS — M1610 Unilateral primary osteoarthritis, unspecified hip: Secondary | ICD-10-CM | POA: Insufficient documentation

## 2013-09-01 DIAGNOSIS — R9431 Abnormal electrocardiogram [ECG] [EKG]: Secondary | ICD-10-CM | POA: Diagnosis not present

## 2013-09-01 DIAGNOSIS — I10 Essential (primary) hypertension: Secondary | ICD-10-CM | POA: Insufficient documentation

## 2013-09-01 DIAGNOSIS — Z01810 Encounter for preprocedural cardiovascular examination: Secondary | ICD-10-CM | POA: Insufficient documentation

## 2013-09-01 DIAGNOSIS — Z01812 Encounter for preprocedural laboratory examination: Secondary | ICD-10-CM | POA: Diagnosis not present

## 2013-09-01 HISTORY — DX: Nausea with vomiting, unspecified: R11.2

## 2013-09-01 HISTORY — PX: KNEE SURGERY: SHX244

## 2013-09-01 HISTORY — DX: Other specified postprocedural states: Z98.890

## 2013-09-01 LAB — CBC
Hemoglobin: 12.3 g/dL (ref 12.0–15.0)
MCH: 30 pg (ref 26.0–34.0)
MCHC: 33.6 g/dL (ref 30.0–36.0)
MCV: 89.3 fL (ref 78.0–100.0)
Platelets: 266 10*3/uL (ref 150–400)

## 2013-09-01 LAB — SURGICAL PCR SCREEN: MRSA, PCR: NEGATIVE

## 2013-09-01 LAB — BASIC METABOLIC PANEL
BUN: 18 mg/dL (ref 6–23)
CO2: 29 mEq/L (ref 19–32)
Calcium: 9.4 mg/dL (ref 8.4–10.5)
GFR calc non Af Amer: 71 mL/min — ABNORMAL LOW (ref >90)
Glucose, Bld: 117 mg/dL — ABNORMAL HIGH (ref 70–99)

## 2013-09-01 LAB — URINALYSIS, ROUTINE W REFLEX MICROSCOPIC
Bilirubin Urine: NEGATIVE
Glucose, UA: NEGATIVE mg/dL
Hgb urine dipstick: NEGATIVE
Specific Gravity, Urine: 1.026 (ref 1.005–1.030)
Urobilinogen, UA: 0.2 mg/dL (ref 0.0–1.0)

## 2013-09-01 LAB — PROTIME-INR: Prothrombin Time: 12.9 seconds (ref 11.6–15.2)

## 2013-09-01 NOTE — Patient Instructions (Addendum)
[name]  09/01/2013   Your procedure is scheduled on:   09-08-2013  Report to [name] at        0745 AM.  Call this number if you have problems the morning of surgery: [phone]  Or Presurgical Testing [phone]([place])     Do not eat food:After Midnight.    Take these medicines the morning of surgery with A SIP OF WATER: Toviaz. Neurontiin. Provigil.   Do not wear jewelry, make-up or nail polish.  Do not wear lotions, powders, or perfumes. You may wear deodorant.  Do not shave 12 hours prior to first CHG shower(legs and under arms).(face and neck okay.)  Do not bring valuables to the hospital.  Contacts, dentures or bridgework,body piercing,  may not be worn into surgery.  Leave suitcase in the car. After surgery it may be brought to your room.  For patients admitted to the hospital, checkout time is 11:00 AM the day of discharge.   Patients discharged the day of surgery will not be allowed to drive home. Must have responsible person with you x 24 hours once discharged.  Name and phone number of your driver: [name] [phone] cell/  Special Instructions: CHG(Chlorhedine 4%-"Hibiclens","Betasept","Aplicare") Shower Use Special Wash: see special instructions.(avoid face and genitals)   Please read over the following fact sheets that you were given: MRSA Information, Blood Transfusion fact sheet, Incentive Spirometry Instruction.    Failure to follow these instructions may result in Cancellation of your surgery.   Patient signature_______________________________________________________  PATIENT NOTIFIED HER SURGERY TIME CHANGED TO 10:25 AM AND SHE SHOULD ARRIVE BY 7:25 AM - ALL OTHER INSTRUCTIONS THE SAME.

## 2013-09-01 NOTE — Pre-Procedure Instructions (Addendum)
09-01-13 EKG/CXR done today. 09-02-13 Patient and Dr. Nilsa Nutting office made aware of Positive Staph aureus -PCR screen. 09-03-13 1030- Dr. Renold Don reviewed EKG per [facility]. Unable to locate any previous EKG with PCP or [facility]. W. Kennon Portela

## 2013-09-02 DIAGNOSIS — Z1231 Encounter for screening mammogram for malignant neoplasm of breast: Secondary | ICD-10-CM | POA: Diagnosis not present

## 2013-09-02 DIAGNOSIS — E042 Nontoxic multinodular goiter: Secondary | ICD-10-CM | POA: Diagnosis not present

## 2013-09-02 DIAGNOSIS — Z124 Encounter for screening for malignant neoplasm of cervix: Secondary | ICD-10-CM | POA: Diagnosis not present

## 2013-09-02 DIAGNOSIS — N3946 Mixed incontinence: Secondary | ICD-10-CM | POA: Diagnosis not present

## 2013-09-02 NOTE — H&P (Signed)
TOTAL HIP ADMISSION H&P  Patient is admitted for left total hip arthroplasty, anterior approach.  Subjective:  Chief Complaint:   Left hip OA / pain  HPI: Michele Walton, 67 y.o. female, has a history of pain and functional disability in the left hip(s) due to arthritis and patient has failed non-surgical conservative treatments for greater than 12 weeks to include NSAID's and/or analgesics, corticosteriod injections and activity modification.  Onset of symptoms was gradual starting 3+ years ago with rapidlly worsening course since that time.The patient noted no past surgery on the left hip(s).  Patient currently rates pain in the left hip at 9 out of 10 with activity. Patient has night pain, worsening of pain with activity and weight bearing, trendelenberg gait, pain that interfers with activities of daily living and pain with passive range of motion. Patient has evidence of periarticular osteophytes and joint space narrowing by imaging studies. This condition presents safety issues increasing the risk of falls. There is no current active infection.  Risks, benefits and expectations were discussed with the patient. Patient understand the risks, benefits and expectations and wishes to proceed with surgery.   D/C Plans:   Home with HHPT  Post-op Meds:   No Rx given  Tranexamic Acid:   To be given  Decadron:    To be given  FYI:    ASA post-op   Patient Active Problem List   Diagnosis Date Noted  . Autoimmune disease, not elsewhere classified(279.49) 08/04/2013  . Multiple sclerosis    Past Medical History  Diagnosis Date  . Multiple sclerosis   . Degenerative joint disease   . Bursitis   . Osteoarthritis   . AC (acromioclavicular) joint bone spurs   . Lupus     hx. subcutaneous lupus.  . Goiter, nontoxic, multinodular   . Skin cancer   . Chronic pain   . High cholesterol   . Hypertension   . Scoliosis   . Arthritis   . Torn meniscus 1/14    left knee  . Autoimmune disease,  not elsewhere classified(279.49) 08/04/2013    Lupus cutaneus,  With SLE type serology - and oligoclonal bands, negative brain MRI .   Marland Kitchen PONV (postoperative nausea and vomiting)     Past Surgical History  Procedure Laterality Date  . Knee surgery Left 09-01-13    1'14  . Tonsillectomy    . Childbirth  4098,1191    admissions  . Dilation and curettage of uterus    . Skin cancer excision      right nasal bridge    No prescriptions prior to admission   Allergies  Allergen Reactions  . Latex Rash    Peels her skin  . Percocet [Oxycodone-Acetaminophen] Nausea Only and Rash  . Sulfa Antibiotics Nausea Only and Rash  . Macrobid [Nitrofurantoin] Nausea Only    History  Substance Use Topics  . Smoking status: Never Smoker   . Smokeless tobacco: Not on file  . Alcohol Use: No    Family History  Problem Relation Age of Onset  . Heart disease Mother   . Cancer Other      Review of Systems  Constitutional: Positive for malaise/fatigue.  HENT: Negative.   Eyes: Negative.   Respiratory: Negative.   Cardiovascular: Negative.   Gastrointestinal: Positive for constipation.  Genitourinary: Positive for urgency and frequency.  Musculoskeletal: Positive for back pain, joint pain and myalgias.  Skin: Positive for itching.  Neurological: Positive for weakness.  Endo/Heme/Allergies: Negative.   Psychiatric/Behavioral: Negative.  Objective:  Physical Exam  Constitutional: She is oriented to person, place, and time. She appears well-developed and well-nourished.  HENT:  Head: Normocephalic and atraumatic.  Mouth/Throat: Oropharynx is clear and moist.  Eyes: Pupils are equal, round, and reactive to light.  Neck: Neck supple. No JVD present. No tracheal deviation present. No thyromegaly present.  Cardiovascular: Normal rate, regular rhythm, normal heart sounds and intact distal pulses.   Respiratory: Effort normal and breath sounds normal. No stridor. No respiratory distress. She  has no wheezes.  GI: Soft. There is no tenderness. There is no guarding.  Musculoskeletal:       Left hip: She exhibits decreased range of motion, decreased strength, tenderness and bony tenderness. She exhibits no swelling, no deformity and no laceration.  Lymphadenopathy:    She has no cervical adenopathy.  Neurological: She is alert and oriented to person, place, and time.  Skin: Skin is warm and dry.  Psychiatric: She has a normal mood and affect.     Labs:  Estimated body mass index is 28.53 kg/(m^2) as calculated from the following:   Height as of 06/16/13: 5\' 3"  (1.6 m).   Weight as of 06/16/13: 73.029 kg (161 lb).   Imaging Review Plain radiographs demonstrate severe degenerative joint disease of the left hip(s). The bone quality appears to be good for age and reported activity level.  Assessment/Plan:  End stage arthritis, left hip(s)  The patient history, physical examination, clinical judgement of the provider and imaging studies are consistent with end stage degenerative joint disease of the left hip(s) and total hip arthroplasty is deemed medically necessary. The treatment options including medical management, injection therapy, arthroscopy and arthroplasty were discussed at length. The risks and benefits of total hip arthroplasty were presented and reviewed. The risks due to aseptic loosening, infection, stiffness, dislocation/subluxation,  thromboembolic complications and other imponderables were discussed.  The patient acknowledged the explanation, agreed to proceed with the plan and consent was signed. Patient is being admitted for inpatient treatment for surgery, pain control, PT, OT, prophylactic antibiotics, VTE prophylaxis, progressive ambulation and ADL's and discharge planning.The patient is planning to be discharged home with home health services.      Anastasio Auerbach Tanaisha Pittman   PAC  09/02/2013, 2:44 PM

## 2013-09-02 NOTE — Progress Notes (Signed)
09-02-13 1710 PCR screen positive for Staph aureus. Will need MUpirocin called to [facility] [address] [phone].

## 2013-09-04 DIAGNOSIS — E042 Nontoxic multinodular goiter: Secondary | ICD-10-CM | POA: Diagnosis not present

## 2013-09-08 ENCOUNTER — Inpatient Hospital Stay (HOSPITAL_COMMUNITY): Payer: Medicare Other

## 2013-09-08 ENCOUNTER — Inpatient Hospital Stay (HOSPITAL_COMMUNITY): Payer: Medicare Other | Admitting: Anesthesiology

## 2013-09-08 ENCOUNTER — Encounter (HOSPITAL_COMMUNITY): Payer: Self-pay | Admitting: *Deleted

## 2013-09-08 ENCOUNTER — Encounter (HOSPITAL_COMMUNITY): Payer: Medicare Other | Admitting: Anesthesiology

## 2013-09-08 ENCOUNTER — Encounter (HOSPITAL_COMMUNITY)
Admission: RE | Disposition: A | Discharge status: Home Health | Payer: Self-pay | Source: Ambulatory Visit | Attending: Orthopedic Surgery

## 2013-09-08 ENCOUNTER — Inpatient Hospital Stay (HOSPITAL_COMMUNITY)
Admission: RE | Admit: 2013-09-08 | Discharge: 2013-09-09 | DRG: 470 | Disposition: A | Discharge status: Home Health | Payer: Medicare Other | Source: Ambulatory Visit | Attending: Orthopedic Surgery | Admitting: Orthopedic Surgery

## 2013-09-08 DIAGNOSIS — M1610 Unilateral primary osteoarthritis, unspecified hip: Principal | ICD-10-CM | POA: Diagnosis present

## 2013-09-08 DIAGNOSIS — G35 Multiple sclerosis: Secondary | ICD-10-CM | POA: Diagnosis present

## 2013-09-08 DIAGNOSIS — D500 Iron deficiency anemia secondary to blood loss (chronic): Secondary | ICD-10-CM | POA: Diagnosis not present

## 2013-09-08 DIAGNOSIS — Z96649 Presence of unspecified artificial hip joint: Secondary | ICD-10-CM | POA: Diagnosis not present

## 2013-09-08 DIAGNOSIS — M329 Systemic lupus erythematosus, unspecified: Secondary | ICD-10-CM | POA: Diagnosis not present

## 2013-09-08 DIAGNOSIS — M4120 Other idiopathic scoliosis, site unspecified: Secondary | ICD-10-CM | POA: Diagnosis present

## 2013-09-08 DIAGNOSIS — I10 Essential (primary) hypertension: Secondary | ICD-10-CM | POA: Diagnosis present

## 2013-09-08 DIAGNOSIS — D62 Acute posthemorrhagic anemia: Secondary | ICD-10-CM | POA: Diagnosis not present

## 2013-09-08 DIAGNOSIS — Z85828 Personal history of other malignant neoplasm of skin: Secondary | ICD-10-CM | POA: Diagnosis not present

## 2013-09-08 DIAGNOSIS — Z471 Aftercare following joint replacement surgery: Secondary | ICD-10-CM | POA: Diagnosis not present

## 2013-09-08 DIAGNOSIS — M25559 Pain in unspecified hip: Secondary | ICD-10-CM | POA: Diagnosis not present

## 2013-09-08 DIAGNOSIS — E7800 Pure hypercholesterolemia, unspecified: Secondary | ICD-10-CM | POA: Diagnosis present

## 2013-09-08 DIAGNOSIS — G8929 Other chronic pain: Secondary | ICD-10-CM | POA: Diagnosis not present

## 2013-09-08 DIAGNOSIS — M169 Osteoarthritis of hip, unspecified: Principal | ICD-10-CM | POA: Diagnosis present

## 2013-09-08 HISTORY — PX: TOTAL HIP ARTHROPLASTY: SHX124

## 2013-09-08 LAB — ABO/RH: ABO/RH(D): O POS

## 2013-09-08 LAB — TYPE AND SCREEN: Antibody Screen: NEGATIVE

## 2013-09-08 SURGERY — ARTHROPLASTY, HIP, TOTAL, ANTERIOR APPROACH
Anesthesia: General | Site: Hip | Laterality: Left | Wound class: Clean

## 2013-09-08 MED ORDER — LACTATED RINGERS IV SOLN: INTRAVENOUS | Status: DC

## 2013-09-08 MED ORDER — CEFAZOLIN SODIUM-DEXTROSE 2-3 GM-% IV SOLR
2.0000 g | INTRAVENOUS | Status: AC
  Administered 2013-09-08: 2 g via INTRAVENOUS

## 2013-09-08 MED ORDER — GLYCOPYRROLATE 0.2 MG/ML IJ SOLN
INTRAMUSCULAR | Status: DC | PRN
  Administered 2013-09-08: 0.4 mg via INTRAVENOUS

## 2013-09-08 MED ORDER — DEXAMETHASONE 6 MG PO TABS
10.0000 mg | ORAL_TABLET | Freq: Once | ORAL | Status: AC
  Administered 2013-09-09: 11:00:00 10 mg via ORAL
  Filled 2013-09-08: qty 1

## 2013-09-08 MED ORDER — METHOCARBAMOL 500 MG PO TABS
500.0000 mg | ORAL_TABLET | Freq: Four times a day (QID) | ORAL | Status: DC | PRN
  Administered 2013-09-08 – 2013-09-09 (×2): 500 mg via ORAL
  Filled 2013-09-08 (×2): qty 1

## 2013-09-08 MED ORDER — SODIUM CHLORIDE 0.9 % IV SOLN
INTRAVENOUS | Status: DC
  Administered 2013-09-08 – 2013-09-09 (×2): via INTRAVENOUS
  Filled 2013-09-08 (×8): qty 1000

## 2013-09-08 MED ORDER — TRANEXAMIC ACID 100 MG/ML IV SOLN
1000.0000 mg | Freq: Once | INTRAVENOUS | Status: AC
  Administered 2013-09-08: 1000 mg via INTRAVENOUS
  Filled 2013-09-08: qty 10

## 2013-09-08 MED ORDER — PHENOL 1.4 % MT LIQD: 1.0000 | OROMUCOSAL | Status: DC | PRN

## 2013-09-08 MED ORDER — GABAPENTIN 300 MG PO CAPS
300.0000 mg | ORAL_CAPSULE | Freq: Three times a day (TID) | ORAL | Status: DC
  Administered 2013-09-08 – 2013-09-09 (×2): 300 mg via ORAL
  Filled 2013-09-08 (×4): qty 1

## 2013-09-08 MED ORDER — PROPOFOL 10 MG/ML IV BOLUS
INTRAVENOUS | Status: DC | PRN
  Administered 2013-09-08: 120 mg via INTRAVENOUS

## 2013-09-08 MED ORDER — ONDANSETRON HCL 4 MG PO TABS: 4.0000 mg | ORAL_TABLET | Freq: Four times a day (QID) | ORAL | Status: DC | PRN

## 2013-09-08 MED ORDER — KETAMINE HCL 50 MG/ML IJ SOLN
INTRAMUSCULAR | Status: DC | PRN
  Administered 2013-09-08 (×5): 10 mg via INTRAMUSCULAR

## 2013-09-08 MED ORDER — ZOLPIDEM TARTRATE 5 MG PO TABS: 5.0000 mg | ORAL_TABLET | Freq: Every evening | ORAL | Status: DC | PRN

## 2013-09-08 MED ORDER — ASPIRIN EC 325 MG PO TBEC
325.0000 mg | DELAYED_RELEASE_TABLET | Freq: Two times a day (BID) | ORAL | Status: DC
  Administered 2013-09-08 – 2013-09-09 (×2): 325 mg via ORAL
  Filled 2013-09-08 (×3): qty 1

## 2013-09-08 MED ORDER — MODAFINIL 200 MG PO TABS: 200.0000 mg | ORAL_TABLET | Freq: Every day | ORAL | Status: DC

## 2013-09-08 MED ORDER — DEXAMETHASONE SODIUM PHOSPHATE 10 MG/ML IJ SOLN
10.0000 mg | Freq: Once | INTRAMUSCULAR | Status: AC
  Administered 2013-09-08: 10 mg via INTRAVENOUS

## 2013-09-08 MED ORDER — CEFAZOLIN SODIUM-DEXTROSE 2-3 GM-% IV SOLR
INTRAVENOUS | Status: AC
  Filled 2013-09-08: qty 50

## 2013-09-08 MED ORDER — NEOSTIGMINE METHYLSULFATE 1 MG/ML IJ SOLN
INTRAMUSCULAR | Status: DC | PRN
  Administered 2013-09-08: 3 mg via INTRAVENOUS

## 2013-09-08 MED ORDER — HYDROMORPHONE HCL PF 1 MG/ML IJ SOLN: 0.5000 mg | INTRAMUSCULAR | Status: DC | PRN

## 2013-09-08 MED ORDER — DEXAMETHASONE SODIUM PHOSPHATE 10 MG/ML IJ SOLN
10.0000 mg | Freq: Once | INTRAMUSCULAR | Status: AC
  Filled 2013-09-08: qty 1

## 2013-09-08 MED ORDER — HYDROCODONE-ACETAMINOPHEN 7.5-325 MG PO TABS
1.0000 | ORAL_TABLET | ORAL | Status: DC
  Administered 2013-09-08 – 2013-09-09 (×6): 1 via ORAL
  Filled 2013-09-08 (×2): qty 1
  Filled 2013-09-08: qty 2
  Filled 2013-09-08 (×3): qty 1

## 2013-09-08 MED ORDER — POLYETHYLENE GLYCOL 3350 17 G PO PACK: 17.0000 g | PACK | Freq: Every day | ORAL | Status: DC | PRN

## 2013-09-08 MED ORDER — HYDROMORPHONE HCL PF 1 MG/ML IJ SOLN
0.2500 mg | INTRAMUSCULAR | Status: DC | PRN
  Administered 2013-09-08 (×2): 0.5 mg via INTRAVENOUS

## 2013-09-08 MED ORDER — ONDANSETRON HCL 4 MG/2ML IJ SOLN: 4.0000 mg | Freq: Four times a day (QID) | INTRAMUSCULAR | Status: DC | PRN

## 2013-09-08 MED ORDER — DOCUSATE SODIUM 100 MG PO CAPS
100.0000 mg | ORAL_CAPSULE | Freq: Two times a day (BID) | ORAL | Status: DC
  Administered 2013-09-08 – 2013-09-09 (×2): 100 mg via ORAL

## 2013-09-08 MED ORDER — FESOTERODINE FUMARATE ER 4 MG PO TB24
4.0000 mg | ORAL_TABLET | Freq: Every day | ORAL | Status: DC
  Administered 2013-09-09: 4 mg via ORAL
  Filled 2013-09-08: qty 1

## 2013-09-08 MED ORDER — LIDOCAINE HCL (CARDIAC) 20 MG/ML IV SOLN
INTRAVENOUS | Status: DC | PRN
  Administered 2013-09-08: 40 mg via INTRAVENOUS

## 2013-09-08 MED ORDER — FERROUS SULFATE 325 (65 FE) MG PO TABS
325.0000 mg | ORAL_TABLET | Freq: Three times a day (TID) | ORAL | Status: DC
  Administered 2013-09-08 – 2013-09-09 (×4): 325 mg via ORAL
  Filled 2013-09-08 (×5): qty 1

## 2013-09-08 MED ORDER — HYDROMORPHONE HCL PF 1 MG/ML IJ SOLN
INTRAMUSCULAR | Status: AC
  Filled 2013-09-08: qty 1

## 2013-09-08 MED ORDER — DIPHENHYDRAMINE HCL 12.5 MG/5ML PO ELIX: 25.0000 mg | ORAL_SOLUTION | Freq: Four times a day (QID) | ORAL | Status: DC | PRN

## 2013-09-08 MED ORDER — ONDANSETRON HCL 4 MG/2ML IJ SOLN
INTRAMUSCULAR | Status: DC | PRN
  Administered 2013-09-08: 4 mg via INTRAMUSCULAR

## 2013-09-08 MED ORDER — ALUM & MAG HYDROXIDE-SIMETH 200-200-20 MG/5ML PO SUSP: 30.0000 mL | ORAL | Status: DC | PRN

## 2013-09-08 MED ORDER — CEFAZOLIN SODIUM 1-5 GM-% IV SOLN
1.0000 g | Freq: Four times a day (QID) | INTRAVENOUS | Status: AC
  Administered 2013-09-08 (×2): 1 g via INTRAVENOUS
  Filled 2013-09-08 (×2): qty 50

## 2013-09-08 MED ORDER — FENTANYL CITRATE 0.05 MG/ML IJ SOLN
INTRAMUSCULAR | Status: DC | PRN
  Administered 2013-09-08 (×3): 50 ug via INTRAVENOUS

## 2013-09-08 MED ORDER — CHLORHEXIDINE GLUCONATE 4 % EX LIQD: 60.0000 mL | Freq: Once | CUTANEOUS | Status: DC

## 2013-09-08 MED ORDER — LACTATED RINGERS IV SOLN
INTRAVENOUS | Status: DC | PRN
  Administered 2013-09-08 (×2): via INTRAVENOUS

## 2013-09-08 MED ORDER — EPHEDRINE SULFATE 50 MG/ML IJ SOLN
INTRAMUSCULAR | Status: DC | PRN
  Administered 2013-09-08: 5 mg via INTRAVENOUS

## 2013-09-08 MED ORDER — SODIUM CHLORIDE 0.9 % IR SOLN
Status: DC | PRN
  Administered 2013-09-08: 1000 mL

## 2013-09-08 MED ORDER — METHOCARBAMOL 100 MG/ML IJ SOLN
500.0000 mg | Freq: Four times a day (QID) | INTRAVENOUS | Status: DC | PRN
  Administered 2013-09-08: 500 mg via INTRAVENOUS
  Filled 2013-09-08: qty 5

## 2013-09-08 MED ORDER — MENTHOL 3 MG MT LOZG: 1.0000 | LOZENGE | OROMUCOSAL | Status: DC | PRN

## 2013-09-08 MED ORDER — ROCURONIUM BROMIDE 100 MG/10ML IV SOLN
INTRAVENOUS | Status: DC | PRN
  Administered 2013-09-08: 30 mg via INTRAVENOUS

## 2013-09-08 MED ORDER — SENNA 8.6 MG PO TABS
1.0000 | ORAL_TABLET | Freq: Two times a day (BID) | ORAL | Status: DC
  Administered 2013-09-08 – 2013-09-09 (×2): 8.6 mg via ORAL
  Filled 2013-09-08 (×2): qty 1

## 2013-09-08 SURGICAL SUPPLY — 41 items
ADH SKN CLS APL DERMABOND .7 (GAUZE/BANDAGES/DRESSINGS) ×1
BAG SPEC THK2 15X12 ZIP CLS (MISCELLANEOUS) ×1
BAG ZIPLOCK 12X15 (MISCELLANEOUS) ×3 IMPLANT
BLADE SAW SGTL 18X1.27X75 (BLADE) ×2 IMPLANT
CAPT HIP PF COP ×1 IMPLANT
CATH FOLEY LATEX FREE 16FR (CATHETERS) ×1 IMPLANT
CLOTH BEACON ORANGE TIMEOUT ST (SAFETY) ×1 IMPLANT
DERMABOND ADVANCED (GAUZE/BANDAGES/DRESSINGS) ×1
DERMABOND ADVANCED .7 DNX12 (GAUZE/BANDAGES/DRESSINGS) ×1 IMPLANT
DRAPE C-ARM 42X120 X-RAY (DRAPES) ×2 IMPLANT
DRAPE STERI IOBAN 125X83 (DRAPES) ×2 IMPLANT
DRAPE U-SHAPE 47X51 STRL (DRAPES) ×6 IMPLANT
DRSG AQUACEL AG ADV 3.5X10 (GAUZE/BANDAGES/DRESSINGS) ×2 IMPLANT
DRSG TEGADERM 4X4.75 (GAUZE/BANDAGES/DRESSINGS) IMPLANT
DURAPREP 26ML APPLICATOR (WOUND CARE) ×2 IMPLANT
ELECT BLADE TIP CTD 4 INCH (ELECTRODE) ×2 IMPLANT
ELECT REM PT RETURN 9FT ADLT (ELECTROSURGICAL) ×2
ELECTRODE REM PT RTRN 9FT ADLT (ELECTROSURGICAL) ×1 IMPLANT
EVACUATOR 1/8 PVC DRAIN (DRAIN) IMPLANT
FACESHIELD LNG OPTICON STERILE (SAFETY) ×8 IMPLANT
GAUZE SPONGE 2X2 8PLY STRL LF (GAUZE/BANDAGES/DRESSINGS) ×1 IMPLANT
GLOVE BIOGEL PI IND STRL 7.5 (GLOVE) ×1 IMPLANT
GLOVE BIOGEL PI IND STRL 8 (GLOVE) ×1 IMPLANT
GLOVE BIOGEL PI INDICATOR 7.5 (GLOVE) ×1
GLOVE BIOGEL PI INDICATOR 8 (GLOVE) ×1
GLOVE ECLIPSE 8.0 STRL XLNG CF (GLOVE) ×2 IMPLANT
GLOVE ORTHO TXT STRL SZ7.5 (GLOVE) ×4 IMPLANT
GOWN BRE IMP PREV XXLGXLNG (GOWN DISPOSABLE) ×2 IMPLANT
GOWN PREVENTION PLUS LG XLONG (DISPOSABLE) ×2 IMPLANT
KIT BASIN OR (CUSTOM PROCEDURE TRAY) ×2 IMPLANT
PACK TOTAL JOINT (CUSTOM PROCEDURE TRAY) ×2 IMPLANT
PADDING CAST COTTON 6X4 STRL (CAST SUPPLIES) ×2 IMPLANT
SPONGE GAUZE 2X2 STER 10/PKG (GAUZE/BANDAGES/DRESSINGS) ×1
SUCTION FRAZIER 12FR DISP (SUCTIONS) ×2 IMPLANT
SUT MNCRL AB 4-0 PS2 18 (SUTURE) ×2 IMPLANT
SUT VIC AB 1 CT1 36 (SUTURE) ×8 IMPLANT
SUT VIC AB 2-0 CT1 27 (SUTURE) ×4
SUT VIC AB 2-0 CT1 TAPERPNT 27 (SUTURE) ×2 IMPLANT
SUT VLOC 180 0 24IN GS25 (SUTURE) IMPLANT
TOWEL OR 17X26 10 PK STRL BLUE (TOWEL DISPOSABLE) ×4 IMPLANT
TRAY FOLEY CATH 14FRSI W/METER (CATHETERS) ×1 IMPLANT

## 2013-09-08 NOTE — Progress Notes (Signed)
Utilization review completed.  

## 2013-09-08 NOTE — Anesthesia Postprocedure Evaluation (Signed)
  Anesthesia Post-op Note  Patient: Michele Walton  Procedure(s) Performed: Procedure(s) (LRB): LEFT TOTAL HIP ARTHROPLASTY ANTERIOR APPROACH (Left)  Patient Location: PACU  Anesthesia Type: General  Level of Consciousness: awake and alert   Airway and Oxygen Therapy: Patient Spontanous Breathing  Post-op Pain: mild  Post-op Assessment: Post-op Vital signs reviewed, Patient's Cardiovascular Status Stable, Respiratory Function Stable, Patent Airway and No signs of Nausea or vomiting  Last Vitals:  Filed Vitals:   09/08/13 1336  BP: 128/74  Pulse: 55  Temp: 36.8 C  Resp: 16    Post-op Vital Signs: stable   Complications: No apparent anesthesia complications

## 2013-09-08 NOTE — Interval H&P Note (Signed)
History and Physical Interval Note:  09/08/2013 8:42 AM  Michele Walton  has presented today for surgery, with the diagnosis of LEFT HIP OA  The various methods of treatment have been discussed with the patient and family. After consideration of risks, benefits and other options for treatment, the patient has consented to  Procedure(s): LEFT TOTAL HIP ARTHROPLASTY ANTERIOR APPROACH (Left) as a surgical intervention .  The patient's history has been reviewed, patient examined, no change in status, stable for surgery.  I have reviewed the patient's chart and labs.  Questions were answered to the patient's satisfaction.     Shelda Pal

## 2013-09-08 NOTE — Transfer of Care (Signed)
Immediate Anesthesia Transfer of Care Note  Patient: Michele Walton  Procedure(s) Performed: Procedure(s): LEFT TOTAL HIP ARTHROPLASTY ANTERIOR APPROACH (Left)  Patient Location: PACU  Anesthesia Type:General  Level of Consciousness: awake, alert  and oriented  Airway & Oxygen Therapy: Patient Spontanous Breathing and Patient connected to face mask oxygen  Post-op Assessment: Report given to PACU RN and Post -op Vital signs reviewed and stable  Post vital signs: Reviewed and stable  Complications: No apparent anesthesia complications

## 2013-09-08 NOTE — Progress Notes (Signed)
pacu note: reddined area on right thigh appears to be going away , area slightly pink now.  Dr. Charlann Boxer and matt. Babish pa made aware

## 2013-09-08 NOTE — Evaluation (Signed)
Physical Therapy Evaluation Patient Details Name: Michele Walton MRN: [id number] DOB: 04-Aug-1946 Today's Date: 09/08/2013 Time: 4098-1191 PT Time Calculation (min): 14 min  PT Assessment / Plan / Recommendation History of Present Illness  67 yo female s/p L THA-DA 10/14.   Clinical Impression  On eval POD 0, pt required Min assist for mobility-able to ambulate ~20 feet with RW. Anticipate pt will progress well with mobility. Recommend HHPT.     PT Assessment  Patient needs continued PT services    Follow Up Recommendations  Home health PT    Does the patient have the potential to tolerate intense rehabilitation      Barriers to Discharge        Equipment Recommendations  None recommended by PT    Recommendations for Other Services OT consult   Frequency 7X/week    Precautions / Restrictions Precautions Precautions: Fall Restrictions Weight Bearing Restrictions: No LLE Weight Bearing: Weight bearing as tolerated   Pertinent Vitals/Pain L hip 4/10 with activity.       Mobility  Bed Mobility Bed Mobility: Supine to Sit;Sit to Supine Supine to Sit: 4: Min assist Sit to Supine: 4: Min assist Details for Bed Mobility Assistance: assist for L LE. VCS safety, technique, hand placement.  Transfers Transfers: Sit to Stand;Stand to Sit Sit to Stand: 4: Min assist;From bed Stand to Sit: 4: Min assist;To bed Details for Transfer Assistance: VCS safety, technique, hand placement. Assist to rise, stabilize, control descent Ambulation/Gait Ambulation/Gait Assistance: 4: Min assist Ambulation Distance (Feet): 20 Feet Assistive device: Rolling walker Ambulation/Gait Assistance Details: VCS safety, technique, sequence, step length.  Gait Pattern: Step-to pattern;Decreased stride length;Antalgic    Exercises     PT Diagnosis: Difficulty walking;Abnormality of gait;Acute pain  PT Problem List: Decreased strength;Decreased range of motion;Decreased activity tolerance;Decreased  mobility;Pain;Decreased knowledge of use of DME PT Treatment Interventions: DME instruction;Gait training;Stair training;Functional mobility training;Therapeutic activities;Therapeutic exercise;Patient/family education     PT Goals(Current goals can be found in the care plan section) Acute Rehab PT Goals Patient Stated Goal: regain independence. less pain PT Goal Formulation: With patient Time For Goal Achievement: 09/15/13 Potential to Achieve Goals: Good  Visit Information  Last PT Received On: 09/08/13 Assistance Needed: +1 History of Present Illness: 67 yo female s/p L THA-DA 10/14.        Prior Functioning  Home Living Family/patient expects to be discharged to:: Private residence Living Arrangements: Spouse/significant other Available Help at Discharge: Family Type of Home: House Home Access: Stairs to enter [employer] of Steps: 1 Home Layout: Two level;Able to live on main level with bedroom/bathroom Home Equipment: Walker - 4 wheels;Cane - single point;Walker - 2 wheels Prior Function Level of Independence: Independent Communication Communication: No difficulties    Cognition  Cognition Arousal/Alertness: Awake/alert Behavior During Therapy: WFL for tasks assessed/performed Overall Cognitive Status: Within Functional Limits for tasks assessed    Extremity/Trunk Assessment Upper Extremity Assessment Upper Extremity Assessment: Overall WFL for tasks assessed Lower Extremity Assessment Lower Extremity Assessment: LLE deficits/detail LLE Deficits / Details: hip flex 2/5, hip abd/add 2/5, moves ankle well.  LLE: Unable to fully assess due to pain Cervical / Trunk Assessment Cervical / Trunk Assessment: Normal   Balance    End of Session PT - End of Session Activity Tolerance: Patient tolerated treatment well Patient left: in bed;with call bell/phone within reach;with family/visitor present Nurse Communication: Mobility status  GP     Rebeca Alert, MPT Pager: [phone]

## 2013-09-08 NOTE — Progress Notes (Signed)
Pacu note: Pt. Arrived in pacu with reddened area on right thigh where bovie pad was placed.

## 2013-09-08 NOTE — Op Note (Signed)
NAME:  Michele Walton                ACCOUNT NO.: [id number]      MEDICAL RECORD NO.: [id number]      FACILITY:  [facility]      PHYSICIAN:  Durene Romans D  DATE OF BIRTH:  May 19, 1946     DATE OF PROCEDURE:  09/08/2013                                 OPERATIVE REPORT         PREOPERATIVE DIAGNOSIS: Left  hip osteoarthritis.      POSTOPERATIVE DIAGNOSIS:  Left hip osteoarthritis.      PROCEDURE:  Left total hip replacement through an anterior approach   utilizing DePuy THR system, component size 50mm pinnacle cup, a size 32+4 neutral   Altrex liner, a size 4 Hi Tri Lock stem with a 32+1 delta ceramic   ball.      SURGEON:  Madlyn Frankel. Charlann Boxer, M.D.      ASSISTANT: Leilani Able, PA-C      ANESTHESIA:  General.      SPECIMENS:  None.      COMPLICATIONS:  None.      BLOOD LOSS:  300 cc     DRAINS:  None      INDICATION OF THE PROCEDURE:  Michele Walton is a 67 y.o. female who had   presented to office for evaluation of left hip pain.  Radiographs revealed   progressive degenerative changes with bone-on-bone   articulation to the  hip joint.  The patient had painful limited range of   motion significantly affecting their overall quality of life.  The patient was failing to    respond to conservative measures, and at this point was ready   to proceed with more definitive measures.  The patient has noted progressive   degenerative changes in his hip, progressive problems and dysfunction   with regarding the hip prior to surgery.  Consent was obtained for   benefit of pain relief.  Specific risk of infection, DVT, component   failure, dislocation, need for revision surgery, as well discussion of   the anterior versus posterior approach were reviewed.  Consent was   obtained for benefit of anterior pain relief through an anterior   approach.      PROCEDURE IN DETAIL:  The patient was brought to operative theater.   Once adequate anesthesia, preoperative  antibiotics, 2gm Ancef administered.   The patient was positioned supine on the OSI Hanna table.  Once adequate   padding of boney process was carried out, we had predraped out the hip, and  used fluoroscopy to confirm orientation of the pelvis and position.      The left hip was then prepped and draped from proximal iliac crest to   mid thigh with shower curtain technique.      Time-out was performed identifying the patient, planned procedure, and   extremity.     An incision was then made 2 cm distal and lateral to the   anterior superior iliac spine extending over the orientation of the   tensor fascia lata muscle and sharp dissection was carried down to the   fascia of the muscle and protractor placed in the soft tissues.      The fascia was then incised.  The muscle belly was identified and swept   laterally  and retractor placed along the superior neck.  Following   cauterization of the circumflex vessels and removing some pericapsular   fat, a second cobra retractor was placed on the inferior neck.  A third   retractor was placed on the anterior acetabulum after elevating the   anterior rectus.  A L-capsulotomy was along the line of the   superior neck to the trochanteric fossa, then extended proximally and   distally.  Tag sutures were placed and the retractors were then placed   intracapsular.  We then identified the trochanteric fossa and   orientation of my neck cut, confirmed this radiographically   and then made a neck osteotomy with the femur on traction.  The femoral   head was removed without difficulty or complication.  Traction was let   off and retractors were placed posterior and anterior around the   acetabulum.      The labrum and foveal tissue were debrided.  I began reaming with a 45mm   reamer and reamed up to 49mm reamer with good bony bed preparation and a 50   cup was chosen.  The final 50mm Pinnacle cup was then impacted under fluoroscopy  to confirm the  depth of penetration and orientation with respect to   abduction.  A screw was placed followed by the hole eliminator.  The final   32+4 neutral Altrex liner was impacted with good visualized rim fit.  The cup was positioned anatomically within the acetabular portion of the pelvis.      At this point, the femur was rolled at 80 degrees.  Further capsule was   released off the inferior aspect of the femoral neck.  I then   released the superior capsule proximally.  The hook was placed laterally   along the femur and elevated manually and held in position with the bed   hook.  The leg was then extended and adducted with the leg rolled to 100   degrees of external rotation.  Once the proximal femur was fully   exposed, I used a box osteotome to set orientation.  I then began   broaching with the starting chili pepper broach and passed this by hand and then broached up to 4.  With the 4 broach in place I chose a high offset neck and did a trial reduction.  The offset was appropriate, leg lengths   appeared to be equal, confirmed radiographically.   Given these findings, I went ahead and dislocated the hip, repositioned all   retractors and positioned the right hip in the extended and abducted position.  The final 4 Hi Tri Lock stem was   chosen and it was impacted down to the level of neck cut.  Based on this   and the trial reduction, a 32+1 delta ceramic ball was chosen and   impacted onto a clean and dry trunnion, and the hip was reduced.  The   hip had been irrigated throughout the case again at this point.  I did   reapproximate the superior capsular leaflet to the anterior leaflet   using #1 Vicryl, placed a medium Hemovac drain deep.  The fascia of the   tensor fascia lata muscle was then reapproximated using #1 Vicryl.  The   remaining wound was closed with 2-0 Vicryl and running 4-0 Monocryl.   The hip was cleaned, dried, and dressed sterilely using Dermabond and   Aquacel dressing.   She was then brought   to recovery  room in stable condition tolerating the procedure well.    Leilani Able, PA-C was present for the entirety of the case involved from   preoperative positioning, perioperative retractor management, general   facilitation of the case, as well as primary wound closure as assistant.            Madlyn Frankel Charlann Boxer, M.D.            MDO/MEDQ  D:  09/18/2011  T:  09/18/2011  Job:  [id number]      Electronically Signed by Durene Romans M.D. on 09/24/2011 09:15:38 AM

## 2013-09-08 NOTE — Anesthesia Preprocedure Evaluation (Addendum)
Anesthesia Evaluation  Patient identified by MRN, date of birth, ID band Patient awake    Reviewed: Allergy & Precautions, H&P , NPO status , Patient's Chart, lab work & pertinent test results  History of Anesthesia Complications (+) PONV  Airway Mallampati: II TM Distance: >3 FB Neck ROM: full    Dental no notable dental hx. (+) Teeth Intact and Dental Advisory Given   Pulmonary neg pulmonary ROS,  breath sounds clear to auscultation  Pulmonary exam normal       Cardiovascular Exercise Tolerance: Good hypertension, Pt. on medications Rhythm:regular Rate:Normal     Neuro/Psych MS ( has been stable lately ).  Scoliosis negative neurological ROS  negative psych ROS   GI/Hepatic negative GI ROS, Neg liver ROS,   Endo/Other  negative endocrine ROS  Renal/GU negative Renal ROS  negative genitourinary   Musculoskeletal   Abdominal   Peds  Hematology negative hematology ROS (+)   Anesthesia Other Findings Cutaneous lupus  Reproductive/Obstetrics negative OB ROS                           Anesthesia Physical Anesthesia Plan  ASA: III  Anesthesia Plan: General   Post-op Pain Management:    Induction: Intravenous  Airway Management Planned: Oral ETT  Additional Equipment:   Intra-op Plan:   Post-operative Plan: Extubation in OR  Informed Consent: I have reviewed the patients History and Physical, chart, labs and discussed the procedure including the risks, benefits and alternatives for the proposed anesthesia with the patient or authorized representative who has indicated his/her understanding and acceptance.   Dental Advisory Given  Plan Discussed with: CRNA and Surgeon  Anesthesia Plan Comments:         Anesthesia Quick Evaluation

## 2013-09-08 NOTE — Preoperative (Signed)
Beta Blockers   Reason not to administer Beta Blockers:Not Applicable 

## 2013-09-08 NOTE — Plan of Care (Signed)
Problem: Consults Goal: Diagnosis- Total Joint Replacement Primary Total Hip     

## 2013-09-09 ENCOUNTER — Encounter (HOSPITAL_COMMUNITY): Payer: Self-pay | Admitting: Orthopedic Surgery

## 2013-09-09 DIAGNOSIS — D500 Iron deficiency anemia secondary to blood loss (chronic): Secondary | ICD-10-CM | POA: Diagnosis not present

## 2013-09-09 LAB — CBC
Hemoglobin: 9.2 g/dL — ABNORMAL LOW (ref 12.0–15.0)
MCH: 29.9 pg (ref 26.0–34.0)
MCHC: 33.7 g/dL (ref 30.0–36.0)
Platelets: 203 10*3/uL (ref 150–400)
RDW: 13.2 % (ref 11.5–15.5)

## 2013-09-09 LAB — BASIC METABOLIC PANEL
BUN: 15 mg/dL (ref 6–23)
Calcium: 8.3 mg/dL — ABNORMAL LOW (ref 8.4–10.5)
GFR calc Af Amer: 85 mL/min — ABNORMAL LOW (ref >90)
GFR calc non Af Amer: 73 mL/min — ABNORMAL LOW (ref >90)
Glucose, Bld: 110 mg/dL — ABNORMAL HIGH (ref 70–99)
Potassium: 3.4 mEq/L — ABNORMAL LOW (ref 3.5–5.1)
Sodium: 135 mEq/L (ref 135–145)

## 2013-09-09 MED ORDER — TIZANIDINE HCL 4 MG PO CAPS: 4.0000 mg | ORAL_CAPSULE | Freq: Three times a day (TID) | ORAL | Status: DC | PRN

## 2013-09-09 MED ORDER — DSS 100 MG PO CAPS: 100.0000 mg | ORAL_CAPSULE | Freq: Two times a day (BID) | ORAL | Status: DC

## 2013-09-09 MED ORDER — POLYETHYLENE GLYCOL 3350 17 G PO PACK: 17.0000 g | PACK | Freq: Every day | ORAL | Status: DC | PRN

## 2013-09-09 MED ORDER — FERROUS SULFATE 325 (65 FE) MG PO TABS: 325.0000 mg | ORAL_TABLET | Freq: Three times a day (TID) | ORAL | Status: DC

## 2013-09-09 MED ORDER — ASPIRIN 325 MG PO TBEC: 325.0000 mg | DELAYED_RELEASE_TABLET | Freq: Two times a day (BID) | ORAL | Status: AC

## 2013-09-09 MED ORDER — HYDROCODONE-ACETAMINOPHEN 7.5-325 MG PO TABS: 1.0000 | ORAL_TABLET | ORAL | Status: DC | PRN

## 2013-09-09 NOTE — Care Management Note (Signed)
    Page 1 of 2   09/09/2013     2:50:43 PM   CARE MANAGEMENT NOTE 09/09/2013  Patient:  [facility]   Account Number:  [id number]  Date Initiated:  09/09/2013  Documentation initiated by:  Colleen Can  Subjective/Objective Assessment:   dx left total hip arthroplasty; anterior approach     Action/Plan:   CM spoke with patient. Plans are for pt to return to her home where spouse will be caregiver. DME has been delivered to patient's room. Genevieve Norlander will provide [facility] services.   Anticipated DC Date:  09/09/2013   Anticipated DC Plan:  HOME W HOME HEALTH SERVICES      DC Planning Services  CM consult      [facility] Choice  HOME HEALTH  DURABLE MEDICAL EQUIPMENT   Choice offered to / List presented to:  C-1 Patient   DME arranged  3-N-1  Levan Hurst      DME agency  [facility]     HH arranged  HH-2 PT      [facility] agency  [facility]   Status of service:  Completed, signed off Medicare Important Message given?  NA - LOS <3 / Initial given by admissions (If response is "NO", the following Medicare IM given date fields will be blank) Date Medicare IM given:   Date Additional Medicare IM given:    Discharge Disposition:  HOME W HOME HEALTH SERVICES  Per UR Regulation:    If discussed at Long Length of Stay Meetings, dates discussed:    Comments:  09/09/2013 Colleen Can BSN RN CCM [phone] Genevieve Norlander will provide [facility] services with start date of day after patient is discharged.

## 2013-09-09 NOTE — Progress Notes (Signed)
   Subjective: 1 Day Post-Op Procedure(s) (LRB): LEFT TOTAL HIP ARTHROPLASTY ANTERIOR APPROACH (Left)   Patient reports pain as mild, pain well controlled. No events throughout the night. Ready to be discharged home if she does well with pt and pain stays well controlled.   Objective:   VITALS:   Filed Vitals:   09/09/13 0620  BP: 90/52  Pulse: 71  Temp: 98.1 F (36.7 C)  Resp: 16    Neurovascular intact Dorsiflexion/Plantar flexion intact Incision: dressing C/D/I No cellulitis present Compartment soft  LABS  Recent Labs  09/09/13 0522  HGB 9.2*  HCT 27.3*  WBC 8.5  PLT 203     Recent Labs  09/09/13 0522  NA 135  K 3.4*  BUN 15  CREATININE 0.81  GLUCOSE 110*     Assessment/Plan: 1 Day Post-Op Procedure(s) (LRB): LEFT TOTAL HIP ARTHROPLASTY ANTERIOR APPROACH (Left) Foley cath d/c'ed Advance diet Up with therapy D/C IV fluids Discharge home with home health Follow up in 2 weeks at [facility]. Follow up with OLIN,Nezzie Manera D in 2 weeks.  Contact information:  [facility] [address] [place] [address] [phone]    Expected ABLA  Treated with iron and will observe        Anastasio Auerbach. Buford Bremer   PAC  09/09/2013, 9:02 AM

## 2013-09-09 NOTE — Progress Notes (Signed)
[facility]  [facility] is providing the following services: RW and Commode  If patient discharges after hours, please call [phone].   Michele Walton 09/09/2013, 8:37 AM

## 2013-09-09 NOTE — Evaluation (Signed)
Occupational Therapy Evaluation Patient Details Name: Michele Walton MRN: [id number] DOB: September 12, 1946 Today's Date: 09/09/2013 Time: 4098-1191 OT Time Calculation (min): 26 min  OT Assessment / Plan / Recommendation History of present illness 67 yo female s/p L THA-DA 10/14.    Clinical Impression   Pt was admitted for the above. All education was completed.  Pt does not need any further OT at this time.       OT Assessment  Patient does not need any further OT services    Follow Up Recommendations  No OT follow up    Barriers to Discharge      Equipment Recommendations  3 in 1 bedside comode    Recommendations for Other Services    Frequency       Precautions / Restrictions Precautions Precautions: Fall Restrictions LLE Weight Bearing: Weight bearing as tolerated   Pertinent Vitals/Pain Pain not bad--premedicated.  Repositioned with ice.      ADL  Grooming: Teeth care;Supervision/safety Where Assessed - Grooming: Supported standing Lower Body Bathing: Minimal assistance Where Assessed - Lower Body Bathing: Supported sit to stand Lower Body Dressing: Minimal assistance Where Assessed - Lower Body Dressing: Supported sit to Pharmacist, hospital: Minimal Dentist Method: Sit to Barista: Comfort height toilet Toileting - Architect and Hygiene: Min guard Where Assessed - Engineer, mining and Hygiene: Sit to stand from 3-in-1 or toilet Tub/Shower Transfer: Minimal assistance Tub/Shower Transfer Method: Science writer: Walk in shower Equipment Used: Rolling walker Transfers/Ambulation Related to ADLs: ambulated to bathroom with min A for sit to stand and min guard/steadying. Pt has a low commode at home and initially wanted to use this without DME.  After practicing transfer to comfort height commode, decided to order 3:1  Husband will be there 24/.7.  Educated on hand  placement for this using walker and toilet seat.   ADL Comments: practiced shower transfer; she has a shower seat (freestanding).  Pt can perform UB adls with set up    OT Diagnosis:    OT Problem List:   OT Treatment Interventions:     OT Goals(Current goals can be found in the care plan section)    Visit Information  Last OT Received On: 09/09/13 Assistance Needed: +1 History of Present Illness: 67 yo female s/p L THA-DA 10/14.        Prior Functioning     Home Living Family/patient expects to be discharged to:: Private residence Living Arrangements: Spouse/significant other Prior Function Level of Independence: Independent Communication Communication: No difficulties         Vision/Perception     Copywriter, advertising Behavior During Therapy: WFL for tasks assessed/performed Overall Cognitive Status: Within Functional Limits for tasks assessed    Extremity/Trunk Assessment Upper Extremity Assessment Upper Extremity Assessment: Overall WFL for tasks assessed Cervical / Trunk Assessment Cervical / Trunk Assessment: Other exceptions (scoliosis; flexes trunk)     Mobility Bed Mobility Supine to Sit: 4: Min assist Details for Bed Mobility Assistance: assist for L LE. VCS safety, technique, hand placement.  Transfers Sit to Stand: 4: Min assist;From bed;From toilet;With upper extremity assist Details for Transfer Assistance: vcs for safety, technique, UE/LE placement     Exercise     Balance     End of Session OT - End of Session Activity Tolerance: Patient tolerated treatment well Patient left: in bed;with call bell/phone within reach  GO     [facility] 09/09/2013, 9:05 AM Marica Otter, OTR/L [phone]  09/09/2013 

## 2013-09-09 NOTE — Plan of Care (Signed)
Problem: Discharge Progression Outcomes Goal: Anticoagulant follow-up in place Outcome: Not Applicable Date Met:  09/09/13 asa

## 2013-09-09 NOTE — Progress Notes (Signed)
Physical Therapy Treatment Patient Details Name: Michele Walton MRN: [id number] DOB: [birth year] Today's Date: 09/09/2013 Time: 4098-1191 PT Time Calculation (min): 13 min  PT Assessment / Plan / Recommendation  History of Present Illness 67 yo female s/p L THA-DA 10/14.    PT Comments   Progressing with mobility. 2nd session to practice steps. All education completed. Pt ready to d/c home from PT standpoint. Recommend HHPT.  Follow Up Recommendations  Home health PT     Does the patient have the potential to tolerate intense rehabilitation     Barriers to Discharge        Equipment Recommendations  None recommended by PT    Recommendations for Other Services    Frequency 7X/week   Progress towards PT Goals Progress towards PT goals: Progressing toward goals  Plan Current plan remains appropriate    Precautions / Restrictions Precautions Precautions: Fall Restrictions Weight Bearing Restrictions: No LLE Weight Bearing: Weight bearing as tolerated   Pertinent Vitals/Pain L hip 5/10 with activity. Ice applied end of session    Mobility  Bed Mobility Bed Mobility: Supine to Sit;Sit to Supine Supine to Sit: 4: Min assist Sit to Supine: 4: Min assist Details for Bed Mobility Assistance: Assist for L LE Transfers Transfers: Sit to Stand;Stand to Sit Sit to Stand: 4: Min assist Stand to Sit: 4: Min assist Details for Transfer Assistance: vcs for safety, technique, UE/LE placement. Assist to rise, stabilize, control descent Ambulation/Gait Ambulation/Gait Assistance: 4: Min guard Ambulation Distance (Feet): 50 Feet Assistive device: Rolling walker Ambulation/Gait Assistance Details: close guard.  Gait Pattern: Step-to pattern;Trunk flexed;Antalgic;Decreased stride length Stairs: Yes Stairs Assistance: 4: Min assist Stairs Assistance Details (indicate cue type and reason): VCs safety, technique, sequence. Husband present and assisted with stabilizing walker.  Stair  Management Technique: With walker;Backwards;Step to pattern Number of Stairs: 2    Exercises     PT Diagnosis:    PT Problem List:   PT Treatment Interventions:     PT Goals (current goals can now be found in the care plan section)    Visit Information  Last PT Received On: 09/09/13 Assistance Needed: +1 History of Present Illness: 67 yo female s/p L THA-DA 10/14.     Subjective Data      Cognition       Balance     End of Session PT - End of Session Equipment Utilized During Treatment: Gait belt Activity Tolerance: Patient tolerated treatment well Patient left: in bed;with call bell/phone within reach;with family/visitor present   GP     Rebeca Alert, MPT Pager: [phone]

## 2013-09-09 NOTE — Progress Notes (Signed)
Discharged from floor via w/c, family with pt. No changes in assessment. Kadarius Cuffe  

## 2013-09-09 NOTE — Progress Notes (Signed)
Physical Therapy Treatment Patient Details Name: Michele Walton MRN: [id number] DOB: 1946/11/08 Today's Date: 09/09/2013 Time: 4098-1191 PT Time Calculation (min): 18 min  PT Assessment / Plan / Recommendation  History of Present Illness 67 yo female s/p L THA-DA 10/14.    PT Comments   Progressing with mobility. Plan is for possible d/c home later today.   Follow Up Recommendations  Home health PT     Does the patient have the potential to tolerate intense rehabilitation     Barriers to Discharge        Equipment Recommendations  None recommended by PT    Recommendations for Other Services    Frequency 7X/week   Progress towards PT Goals Progress towards PT goals: Progressing toward goals  Plan Current plan remains appropriate    Precautions / Restrictions Precautions Precautions: Fall Restrictions Weight Bearing Restrictions: No LLE Weight Bearing: Weight bearing as tolerated   Pertinent Vitals/Pain 5/10 L hip with activity. Ice applied end of session    Mobility  Bed Mobility Bed Mobility: Supine to Sit;Sit to Supine Supine to Sit: 4: Min assist Sit to Supine: 4: Min assist Details for Bed Mobility Assistance: Assist for L LE Transfers Transfers: Sit to Stand;Stand to Sit Sit to Stand: 4: Min assist;From bed Stand to Sit: 4: Min guard;To bed Details for Transfer Assistance: vcs for safety, technique, UE/LE placement. Assist to rise, stabilize Ambulation/Gait Ambulation/Gait Assistance: 4: Min guard;4: Min Environmental consultant (Feet): 60 Feet Assistive device: Rolling walker Ambulation/Gait Assistance Details: VCs safety, sequence, posture. Assist to stabilize intermittently. Gait Pattern: Step-to pattern;Trunk flexed;Antalgic;Decreased stride length    Exercises     PT Diagnosis:    PT Problem List:   PT Treatment Interventions:     PT Goals (current goals can now be found in the care plan section)    Visit Information  Last PT Received On:  09/09/13 Assistance Needed: +1 History of Present Illness: 67 yo female s/p L THA-DA 10/14.     Subjective Data      Cognition  Cognition Arousal/Alertness: Awake/alert Behavior During Therapy: WFL for tasks assessed/performed Overall Cognitive Status: Within Functional Limits for tasks assessed    Balance     End of Session PT - End of Session Equipment Utilized During Treatment: Gait belt Activity Tolerance: Patient tolerated treatment well Patient left: in bed;with call bell/phone within reach;with family/visitor present   GP     Rebeca Alert, MPT Pager: [phone]

## 2013-09-10 DIAGNOSIS — D62 Acute posthemorrhagic anemia: Secondary | ICD-10-CM | POA: Diagnosis not present

## 2013-09-10 DIAGNOSIS — M169 Osteoarthritis of hip, unspecified: Secondary | ICD-10-CM | POA: Diagnosis not present

## 2013-09-10 DIAGNOSIS — Z96649 Presence of unspecified artificial hip joint: Secondary | ICD-10-CM | POA: Diagnosis not present

## 2013-09-10 DIAGNOSIS — I10 Essential (primary) hypertension: Secondary | ICD-10-CM | POA: Diagnosis not present

## 2013-09-10 DIAGNOSIS — IMO0001 Reserved for inherently not codable concepts without codable children: Secondary | ICD-10-CM | POA: Diagnosis not present

## 2013-09-10 DIAGNOSIS — Z471 Aftercare following joint replacement surgery: Secondary | ICD-10-CM | POA: Diagnosis not present

## 2013-09-10 NOTE — Discharge Summary (Signed)
Physician Discharge Summary  Patient ID: Michele Walton MRN: [id number] DOB/AGE: 05-07-1946 67 y.o.  Admit date: 09/08/2013 Discharge date: 09/09/2013   Procedures:  Procedure(s) (LRB): LEFT TOTAL HIP ARTHROPLASTY ANTERIOR APPROACH (Left)  Attending Physician:  Dr. Durene Romans   Admission Diagnoses:   Left hip OA / pain  Discharge Diagnoses:  Principal Problem:   S/P left THA, AA Active Problems:   Expected blood loss anemia  Past Medical History  Diagnosis Date  . Multiple sclerosis   . Degenerative joint disease   . Bursitis   . Osteoarthritis   . AC (acromioclavicular) joint bone spurs   . Lupus     hx. subcutaneous lupus.  . Goiter, nontoxic, multinodular   . Skin cancer   . Chronic pain   . High cholesterol   . Hypertension   . Scoliosis   . Arthritis   . Torn meniscus 1/14    left knee  . 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   Marland Kitchen PONV (postoperative nausea and vomiting)     HPI: Michele Walton, 67 y.o. female, has a history of pain and functional disability in the left hip(s) due to arthritis and patient has failed non-surgical conservative treatments for greater than 12 weeks to include NSAID's and/or analgesics, corticosteriod injections and activity modification. Onset of symptoms was gradual starting 3+ years ago with rapidlly worsening course since that time.The patient noted no past surgery on the left hip(s). Patient currently rates pain in the left hip at 9 out of 10 with activity. Patient has night pain, worsening of pain with activity and weight bearing, trendelenberg gait, pain that interfers with activities of daily living and pain with passive range of motion. Patient has evidence of periarticular osteophytes and joint space narrowing by imaging studies. This condition presents safety issues increasing the risk of falls. There is no  current active infection. Risks, benefits and expectations were discussed with the patient. Patient understand the risks, benefits and expectations and wishes to proceed with surgery.   PCP: Michele Espy, MD   Discharged Condition: good  Hospital Course:  Patient underwent the above stated procedure on 09/08/2013. Patient tolerated the procedure well and brought to the recovery room in good condition and subsequently to the floor.  POD #1 BP: 90/52 ; Pulse: 71 ; Temp: 98.1 F (36.7 C) ; Resp: 16  Pt's foley was removed, as well as the hemovac drain removed. IV was changed to a saline lock. Patient reports pain as mild, pain well controlled. No events throughout the night. Ready to be discharged home. Neurovascular intact, dorsiflexion/plantar flexion intact, incision: dressing C/D/I, no cellulitis present and compartment soft.   LABS  Basename    HGB  9.2  HCT  27.3    Discharge Exam: General appearance: alert, cooperative and no distress Extremities: Homans sign is negative, no sign of DVT, no edema, redness or tenderness in the calves or thighs and no ulcers, gangrene or trophic changes  Disposition:    Home-Health Care Svc with follow up in 2 weeks   Follow-up Information   Follow up with Shelda Pal, MD. Schedule an appointment as soon as possible for a visit in 2 weeks.   Specialty:  Orthopedic Surgery   Contact information:   [address] [place] [address] [phone]       Discharge Orders   Future Appointments Provider Department Dept  Phone   06/16/2014 3:30 PM Melvyn Novas, MD [facility] [phone]   Future Orders Complete By Expires   Call MD / Call 911  As directed    Comments:     If you experience chest pain or shortness of breath, CALL 911 and be transported to the hospital emergency room.  If you develope a fever above 101 F, pus (white drainage) or increased drainage or redness at the wound, or calf  pain, call your surgeon's office.   Change dressing  As directed    Comments:     Maintain surgical dressing for 10-14 days, then replace with 4x4 guaze and tape. Keep the area dry and clean.   Constipation Prevention  As directed    Comments:     Drink plenty of fluids.  Prune juice may be helpful.  You may use a stool softener, such as Colace (over the counter) 100 mg twice a day.  Use MiraLax (over the counter) for constipation as needed.   Diet - low sodium heart healthy  As directed    Discharge instructions  As directed    Comments:     Maintain surgical dressing for 10-14 days, then replace with gauze and tape. Keep the area dry and clean until follow up. Follow up in 2 [facility] at [facility]. Call with any questions or concerns.   Increase activity slowly as tolerated  As directed    TED hose  As directed    Comments:     Use stockings (TED hose) for 2 weeks on both leg(s).  You may remove them at night for sleeping.   Weight bearing as tolerated  As directed    Questions:     Laterality:     Extremity:          Medication List    STOP taking these medications       hydroxychloroquine 200 MG tablet  Commonly known as:  PLAQUENIL     naproxen 500 MG tablet  Commonly known as:  NAPROSYN      TAKE these medications       aspirin 325 MG EC tablet  Take 1 tablet (325 mg total) by mouth 2 (two) times daily.     benazepril-hydrochlorthiazide 20-25 MG per tablet  Commonly known as:  LOTENSIN HCT  Take 1 tablet by mouth every morning.     calcium carbonate 200 MG capsule  Take 200 mg by mouth daily.     cholecalciferol 1000 UNITS tablet  Commonly known as:  VITAMIN D  Take 1,000 Units by mouth daily.     clobetasol cream 0.05 %  Commonly known as:  TEMOVATE  Apply 1 application topically 2 (two) times daily.     DSS 100 MG Caps  Take 100 mg by mouth 2 (two) times daily.     ferrous sulfate 325 (65 FE) MG tablet  Take 1 tablet (325 mg total) by mouth  3 (three) times daily after meals.     gabapentin 300 MG capsule  Commonly known as:  NEURONTIN  Take 1 capsule (300 mg total) by mouth 3 (three) times daily.     HYDROcodone-acetaminophen 7.5-325 MG per tablet  Commonly known as:  NORCO  Take 1-2 tablets by mouth every 4 (four) hours as needed for pain.     modafinil 200 MG tablet  Commonly known as:  PROVIGIL  Take 1 tablet (200 mg total) by mouth daily.     polyethylene glycol packet  Commonly known as:  MIRALAX / GLYCOLAX  Take 17 g by mouth daily as needed.     tiZANidine 4 MG capsule  Commonly known as:  ZANAFLEX  Take 1 capsule (4 mg total) by mouth 3 (three) times daily as needed for muscle spasms.     TOVIAZ 4 MG Tb24 tablet  Generic drug:  fesoterodine  Take 4 mg by mouth daily.     VITAMIN B 12 PO  Take 1,000 mcg by mouth daily.     vitamin E 400 UNIT capsule  Take 400 Units by mouth daily.         Signed: Anastasio Auerbach. Author [place]   PAC  09/10/2013, 8:56 AM

## 2013-09-14 DIAGNOSIS — Z471 Aftercare following joint replacement surgery: Secondary | ICD-10-CM | POA: Diagnosis not present

## 2013-09-14 DIAGNOSIS — I10 Essential (primary) hypertension: Secondary | ICD-10-CM | POA: Diagnosis not present

## 2013-09-14 DIAGNOSIS — D62 Acute posthemorrhagic anemia: Secondary | ICD-10-CM | POA: Diagnosis not present

## 2013-09-14 DIAGNOSIS — IMO0001 Reserved for inherently not codable concepts without codable children: Secondary | ICD-10-CM | POA: Diagnosis not present

## 2013-09-14 DIAGNOSIS — Z96649 Presence of unspecified artificial hip joint: Secondary | ICD-10-CM | POA: Diagnosis not present

## 2013-09-15 DIAGNOSIS — D62 Acute posthemorrhagic anemia: Secondary | ICD-10-CM | POA: Diagnosis not present

## 2013-09-15 DIAGNOSIS — Z96649 Presence of unspecified artificial hip joint: Secondary | ICD-10-CM | POA: Diagnosis not present

## 2013-09-15 DIAGNOSIS — I10 Essential (primary) hypertension: Secondary | ICD-10-CM | POA: Diagnosis not present

## 2013-09-15 DIAGNOSIS — Z471 Aftercare following joint replacement surgery: Secondary | ICD-10-CM | POA: Diagnosis not present

## 2013-09-15 DIAGNOSIS — IMO0001 Reserved for inherently not codable concepts without codable children: Secondary | ICD-10-CM | POA: Diagnosis not present

## 2013-09-17 DIAGNOSIS — D62 Acute posthemorrhagic anemia: Secondary | ICD-10-CM | POA: Diagnosis not present

## 2013-09-17 DIAGNOSIS — IMO0001 Reserved for inherently not codable concepts without codable children: Secondary | ICD-10-CM | POA: Diagnosis not present

## 2013-09-17 DIAGNOSIS — Z471 Aftercare following joint replacement surgery: Secondary | ICD-10-CM | POA: Diagnosis not present

## 2013-09-17 DIAGNOSIS — I10 Essential (primary) hypertension: Secondary | ICD-10-CM | POA: Diagnosis not present

## 2013-09-17 DIAGNOSIS — Z96649 Presence of unspecified artificial hip joint: Secondary | ICD-10-CM | POA: Diagnosis not present

## 2013-09-21 DIAGNOSIS — D62 Acute posthemorrhagic anemia: Secondary | ICD-10-CM | POA: Diagnosis not present

## 2013-09-21 DIAGNOSIS — I10 Essential (primary) hypertension: Secondary | ICD-10-CM | POA: Diagnosis not present

## 2013-09-21 DIAGNOSIS — IMO0001 Reserved for inherently not codable concepts without codable children: Secondary | ICD-10-CM | POA: Diagnosis not present

## 2013-09-21 DIAGNOSIS — Z471 Aftercare following joint replacement surgery: Secondary | ICD-10-CM | POA: Diagnosis not present

## 2013-09-21 DIAGNOSIS — Z96649 Presence of unspecified artificial hip joint: Secondary | ICD-10-CM | POA: Diagnosis not present

## 2013-09-23 DIAGNOSIS — D62 Acute posthemorrhagic anemia: Secondary | ICD-10-CM | POA: Diagnosis not present

## 2013-09-23 DIAGNOSIS — Z471 Aftercare following joint replacement surgery: Secondary | ICD-10-CM | POA: Diagnosis not present

## 2013-09-23 DIAGNOSIS — I10 Essential (primary) hypertension: Secondary | ICD-10-CM | POA: Diagnosis not present

## 2013-09-23 DIAGNOSIS — Z96649 Presence of unspecified artificial hip joint: Secondary | ICD-10-CM | POA: Diagnosis not present

## 2013-09-23 DIAGNOSIS — IMO0001 Reserved for inherently not codable concepts without codable children: Secondary | ICD-10-CM | POA: Diagnosis not present

## 2013-09-24 DIAGNOSIS — Z471 Aftercare following joint replacement surgery: Secondary | ICD-10-CM | POA: Diagnosis not present

## 2013-09-24 DIAGNOSIS — IMO0001 Reserved for inherently not codable concepts without codable children: Secondary | ICD-10-CM | POA: Diagnosis not present

## 2013-09-24 DIAGNOSIS — Z96649 Presence of unspecified artificial hip joint: Secondary | ICD-10-CM | POA: Diagnosis not present

## 2013-09-24 DIAGNOSIS — I10 Essential (primary) hypertension: Secondary | ICD-10-CM | POA: Diagnosis not present

## 2013-09-24 DIAGNOSIS — D62 Acute posthemorrhagic anemia: Secondary | ICD-10-CM | POA: Diagnosis not present

## 2013-09-28 DIAGNOSIS — D62 Acute posthemorrhagic anemia: Secondary | ICD-10-CM | POA: Diagnosis not present

## 2013-09-28 DIAGNOSIS — I10 Essential (primary) hypertension: Secondary | ICD-10-CM | POA: Diagnosis not present

## 2013-09-28 DIAGNOSIS — IMO0001 Reserved for inherently not codable concepts without codable children: Secondary | ICD-10-CM | POA: Diagnosis not present

## 2013-09-28 DIAGNOSIS — Z471 Aftercare following joint replacement surgery: Secondary | ICD-10-CM | POA: Diagnosis not present

## 2013-09-28 DIAGNOSIS — Z96649 Presence of unspecified artificial hip joint: Secondary | ICD-10-CM | POA: Diagnosis not present

## 2013-09-29 DIAGNOSIS — D62 Acute posthemorrhagic anemia: Secondary | ICD-10-CM | POA: Diagnosis not present

## 2013-09-29 DIAGNOSIS — I10 Essential (primary) hypertension: Secondary | ICD-10-CM | POA: Diagnosis not present

## 2013-09-29 DIAGNOSIS — Z471 Aftercare following joint replacement surgery: Secondary | ICD-10-CM | POA: Diagnosis not present

## 2013-09-29 DIAGNOSIS — Z96649 Presence of unspecified artificial hip joint: Secondary | ICD-10-CM | POA: Diagnosis not present

## 2013-09-29 DIAGNOSIS — IMO0001 Reserved for inherently not codable concepts without codable children: Secondary | ICD-10-CM | POA: Diagnosis not present

## 2013-10-01 ENCOUNTER — Other Ambulatory Visit: Payer: Self-pay

## 2013-10-05 DIAGNOSIS — I10 Essential (primary) hypertension: Secondary | ICD-10-CM | POA: Diagnosis not present

## 2013-10-05 DIAGNOSIS — Z96649 Presence of unspecified artificial hip joint: Secondary | ICD-10-CM | POA: Diagnosis not present

## 2013-10-05 DIAGNOSIS — Z471 Aftercare following joint replacement surgery: Secondary | ICD-10-CM | POA: Diagnosis not present

## 2013-10-05 DIAGNOSIS — D62 Acute posthemorrhagic anemia: Secondary | ICD-10-CM | POA: Diagnosis not present

## 2013-10-05 DIAGNOSIS — IMO0001 Reserved for inherently not codable concepts without codable children: Secondary | ICD-10-CM | POA: Diagnosis not present

## 2013-10-06 DIAGNOSIS — M25559 Pain in unspecified hip: Secondary | ICD-10-CM | POA: Diagnosis not present

## 2013-10-08 DIAGNOSIS — M25559 Pain in unspecified hip: Secondary | ICD-10-CM | POA: Diagnosis not present

## 2013-10-14 DIAGNOSIS — M25559 Pain in unspecified hip: Secondary | ICD-10-CM | POA: Diagnosis not present

## 2013-10-16 DIAGNOSIS — M25559 Pain in unspecified hip: Secondary | ICD-10-CM | POA: Diagnosis not present

## 2013-10-25 ENCOUNTER — Telehealth: Payer: Self-pay

## 2013-10-25 NOTE — Telephone Encounter (Signed)
[facility] Medicare sent [place] a letter saying they have approved our request for coverage on Neurontin effective until 09/25/2014

## 2013-10-25 NOTE — Telephone Encounter (Signed)
[facility] Medicare sent [place] a letter saying they have approved our request for coverage on Modafinil effective until 09/25/2014

## 2013-10-27 DIAGNOSIS — M25559 Pain in unspecified hip: Secondary | ICD-10-CM | POA: Diagnosis not present

## 2013-10-28 DIAGNOSIS — M25559 Pain in unspecified hip: Secondary | ICD-10-CM | POA: Diagnosis not present

## 2013-10-29 DIAGNOSIS — M25559 Pain in unspecified hip: Secondary | ICD-10-CM | POA: Diagnosis not present

## 2013-11-02 DIAGNOSIS — M25559 Pain in unspecified hip: Secondary | ICD-10-CM | POA: Diagnosis not present

## 2013-11-04 DIAGNOSIS — N318 Other neuromuscular dysfunction of bladder: Secondary | ICD-10-CM | POA: Diagnosis not present

## 2013-11-04 DIAGNOSIS — N390 Urinary tract infection, site not specified: Secondary | ICD-10-CM | POA: Diagnosis not present

## 2013-11-05 DIAGNOSIS — M25559 Pain in unspecified hip: Secondary | ICD-10-CM | POA: Diagnosis not present

## 2013-11-06 DIAGNOSIS — M4120 Other idiopathic scoliosis, site unspecified: Secondary | ICD-10-CM | POA: Diagnosis not present

## 2013-11-10 DIAGNOSIS — M4120 Other idiopathic scoliosis, site unspecified: Secondary | ICD-10-CM | POA: Diagnosis not present

## 2013-11-12 DIAGNOSIS — M4120 Other idiopathic scoliosis, site unspecified: Secondary | ICD-10-CM | POA: Diagnosis not present

## 2013-11-16 DIAGNOSIS — M4120 Other idiopathic scoliosis, site unspecified: Secondary | ICD-10-CM | POA: Diagnosis not present

## 2013-11-24 DIAGNOSIS — M4120 Other idiopathic scoliosis, site unspecified: Secondary | ICD-10-CM | POA: Diagnosis not present

## 2013-12-01 DIAGNOSIS — M4120 Other idiopathic scoliosis, site unspecified: Secondary | ICD-10-CM | POA: Diagnosis not present

## 2013-12-04 DIAGNOSIS — M4120 Other idiopathic scoliosis, site unspecified: Secondary | ICD-10-CM | POA: Diagnosis not present

## 2013-12-08 DIAGNOSIS — M4120 Other idiopathic scoliosis, site unspecified: Secondary | ICD-10-CM | POA: Diagnosis not present

## 2013-12-10 DIAGNOSIS — M4120 Other idiopathic scoliosis, site unspecified: Secondary | ICD-10-CM | POA: Diagnosis not present

## 2013-12-15 DIAGNOSIS — M4120 Other idiopathic scoliosis, site unspecified: Secondary | ICD-10-CM | POA: Diagnosis not present

## 2013-12-17 DIAGNOSIS — M4120 Other idiopathic scoliosis, site unspecified: Secondary | ICD-10-CM | POA: Diagnosis not present

## 2013-12-22 DIAGNOSIS — M4120 Other idiopathic scoliosis, site unspecified: Secondary | ICD-10-CM | POA: Diagnosis not present

## 2013-12-24 DIAGNOSIS — M4120 Other idiopathic scoliosis, site unspecified: Secondary | ICD-10-CM | POA: Diagnosis not present

## 2013-12-29 DIAGNOSIS — M4120 Other idiopathic scoliosis, site unspecified: Secondary | ICD-10-CM | POA: Diagnosis not present

## 2013-12-30 DIAGNOSIS — M48061 Spinal stenosis, lumbar region without neurogenic claudication: Secondary | ICD-10-CM | POA: Diagnosis not present

## 2013-12-30 DIAGNOSIS — M419 Scoliosis, unspecified: Secondary | ICD-10-CM | POA: Insufficient documentation

## 2013-12-30 DIAGNOSIS — M4120 Other idiopathic scoliosis, site unspecified: Secondary | ICD-10-CM | POA: Diagnosis not present

## 2013-12-30 DIAGNOSIS — IMO0002 Reserved for concepts with insufficient information to code with codable children: Secondary | ICD-10-CM | POA: Diagnosis not present

## 2013-12-31 DIAGNOSIS — M4120 Other idiopathic scoliosis, site unspecified: Secondary | ICD-10-CM | POA: Diagnosis not present

## 2014-01-01 DIAGNOSIS — M5430 Sciatica, unspecified side: Secondary | ICD-10-CM | POA: Diagnosis not present

## 2014-01-01 DIAGNOSIS — M48061 Spinal stenosis, lumbar region without neurogenic claudication: Secondary | ICD-10-CM | POA: Diagnosis not present

## 2014-01-01 DIAGNOSIS — M4120 Other idiopathic scoliosis, site unspecified: Secondary | ICD-10-CM | POA: Diagnosis not present

## 2014-01-05 DIAGNOSIS — M4120 Other idiopathic scoliosis, site unspecified: Secondary | ICD-10-CM | POA: Diagnosis not present

## 2014-01-07 DIAGNOSIS — M4120 Other idiopathic scoliosis, site unspecified: Secondary | ICD-10-CM | POA: Diagnosis not present

## 2014-01-12 DIAGNOSIS — M4120 Other idiopathic scoliosis, site unspecified: Secondary | ICD-10-CM | POA: Diagnosis not present

## 2014-01-14 DIAGNOSIS — M4120 Other idiopathic scoliosis, site unspecified: Secondary | ICD-10-CM | POA: Diagnosis not present

## 2014-02-03 DIAGNOSIS — Z79899 Other long term (current) drug therapy: Secondary | ICD-10-CM | POA: Diagnosis not present

## 2014-02-03 DIAGNOSIS — L930 Discoid lupus erythematosus: Secondary | ICD-10-CM | POA: Diagnosis not present

## 2014-03-22 DIAGNOSIS — L989 Disorder of the skin and subcutaneous tissue, unspecified: Secondary | ICD-10-CM | POA: Diagnosis not present

## 2014-03-22 DIAGNOSIS — E7800 Pure hypercholesterolemia, unspecified: Secondary | ICD-10-CM | POA: Diagnosis not present

## 2014-03-22 DIAGNOSIS — I10 Essential (primary) hypertension: Secondary | ICD-10-CM | POA: Diagnosis not present

## 2014-04-02 ENCOUNTER — Other Ambulatory Visit: Payer: Self-pay | Admitting: Neurology

## 2014-04-02 NOTE — Telephone Encounter (Signed)
Rx signed and faxed.

## 2014-05-27 DIAGNOSIS — H04129 Dry eye syndrome of unspecified lacrimal gland: Secondary | ICD-10-CM | POA: Diagnosis not present

## 2014-05-27 DIAGNOSIS — H2589 Other age-related cataract: Secondary | ICD-10-CM | POA: Diagnosis not present

## 2014-05-27 DIAGNOSIS — H33309 Unspecified retinal break, unspecified eye: Secondary | ICD-10-CM | POA: Diagnosis not present

## 2014-05-27 DIAGNOSIS — H31009 Unspecified chorioretinal scars, unspecified eye: Secondary | ICD-10-CM | POA: Diagnosis not present

## 2014-05-27 DIAGNOSIS — H43819 Vitreous degeneration, unspecified eye: Secondary | ICD-10-CM | POA: Diagnosis not present

## 2014-06-16 ENCOUNTER — Ambulatory Visit: Payer: Medicare Other | Admitting: Neurology

## 2014-06-27 ENCOUNTER — Other Ambulatory Visit: Payer: Self-pay | Admitting: Neurology

## 2014-06-28 NOTE — Telephone Encounter (Signed)
Patient did not come in for last scheduled appt.  Sending one refill noting appt is needed.

## 2014-07-12 ENCOUNTER — Other Ambulatory Visit: Payer: Self-pay | Admitting: Neurology

## 2014-07-12 DIAGNOSIS — J310 Chronic rhinitis: Secondary | ICD-10-CM | POA: Diagnosis not present

## 2014-07-12 DIAGNOSIS — K117 Disturbances of salivary secretion: Secondary | ICD-10-CM | POA: Diagnosis not present

## 2014-07-13 NOTE — Telephone Encounter (Signed)
No showed last appt  

## 2014-07-30 ENCOUNTER — Other Ambulatory Visit: Payer: Self-pay | Admitting: Neurology

## 2014-07-30 NOTE — Telephone Encounter (Signed)
No showed last appt  

## 2014-08-05 ENCOUNTER — Other Ambulatory Visit: Payer: Self-pay | Admitting: Neurology

## 2014-08-05 ENCOUNTER — Telehealth: Payer: Self-pay | Admitting: Neurology

## 2014-08-05 NOTE — Telephone Encounter (Signed)
Patient called stating she needs refill on her Neurontin (name brand) and Naprosyn.  She has an appointment 08-11-14 and would like enough called in until she come for appointment.

## 2014-08-05 NOTE — Telephone Encounter (Signed)
Rx's were sent to last until OV

## 2014-08-06 DIAGNOSIS — Z79899 Other long term (current) drug therapy: Secondary | ICD-10-CM | POA: Diagnosis not present

## 2014-08-06 DIAGNOSIS — L930 Discoid lupus erythematosus: Secondary | ICD-10-CM | POA: Diagnosis not present

## 2014-08-11 ENCOUNTER — Ambulatory Visit (INDEPENDENT_AMBULATORY_CARE_PROVIDER_SITE_OTHER): Payer: Medicare Other | Admitting: Adult Health

## 2014-08-11 ENCOUNTER — Encounter: Payer: Self-pay | Admitting: Adult Health

## 2014-08-11 VITALS — BP 126/85 | HR 89 | Ht 62.0 in | Wt 161.0 lb

## 2014-08-11 DIAGNOSIS — G35 Multiple sclerosis: Secondary | ICD-10-CM | POA: Diagnosis not present

## 2014-08-11 DIAGNOSIS — H04123 Dry eye syndrome of bilateral lacrimal glands: Secondary | ICD-10-CM

## 2014-08-11 DIAGNOSIS — M359 Systemic involvement of connective tissue, unspecified: Secondary | ICD-10-CM

## 2014-08-11 DIAGNOSIS — K117 Disturbances of salivary secretion: Secondary | ICD-10-CM | POA: Diagnosis not present

## 2014-08-11 DIAGNOSIS — H04129 Dry eye syndrome of unspecified lacrimal gland: Secondary | ICD-10-CM | POA: Diagnosis not present

## 2014-08-11 DIAGNOSIS — R682 Dry mouth, unspecified: Secondary | ICD-10-CM

## 2014-08-11 MED ORDER — NEURONTIN 300 MG PO CAPS: ORAL_CAPSULE | ORAL | Status: DC

## 2014-08-11 MED ORDER — NAPROXEN 500 MG PO TABS: ORAL_TABLET | ORAL | Status: DC

## 2014-08-11 NOTE — Progress Notes (Signed)
PATIENT: Michele Walton DOB: [birth year]  REASON FOR VISIT: follow up HISTORY FROM: patient  HISTORY OF PRESENT ILLNESS: Michele Walton is a 68 year old female with a possible history of Multiple sclerosis. She returns today for follow-up. She returns today for follow-up. Patient states that in the last three months she has had severe dry mouth and dry eyes. She states that her nose has been getting congested and feels like something is stuck way up in her head. She states that her eyes are so dry that sometimes she can't open eyes without putting on some lubrication. When she was first diagnosed with MS in 1993 she had numbness and tingling in the right foot. Over time it progressed up her leg to her groin. She has never been on any disease modifying agents. She was given gabapentin, naprosyn and modafinil to help with her symptoms. She states that it progressed over the years and now she has some numbness and tingling from the waist down. She also has a history of lumbar scoliosis, kyphosis, spinal stenosis, osteoarthritis, bone spur on the left pelvis. In 2013 she was diagnosed with cutaneous Lupus- she sees a dermatologist for this. Since her last visit she has had some progression of her back discomfort unsure if its due to scoliosis/stenosis or MS. She has an incontinent bladder- this is not new. She is having some difficulty walking but thinks it's due to her anatomy. She fell in 2014, she hit her head on the file cabinet. Her face was bruised. She went the [facility] emergency clinic at that time. She does have some chronic fatigue however she pushes herself to continue on.    HISTORY 08/04/13 (CD): Michele Walton has been followed for multiple sclerosis in this office. She had been originally evaluated in November 1994 when she presented for sensory abnormalities That were felt to relate to the spinal cord level at the thoracic eighth vertebrae.  She presented at that time with hyperreflexia on the left.  An MRI of the spinal cord showed what was thought to be demyelinating Plaques.  She had normal MRIs of the brain in followup, normal Evoked visual potentials, and initially CSF was negative for oligoclonal bands. There has always been a question of another autoimmune disease being involved since the patient tested positive for antinuclear antibodies. She was referred to rheumatology and started on Plaquenil. She followed also at [facility] from 2000 on this Dr. Cyndra Numbers will prescribed Betaseron. Later that year, she returned to [facility] and followed up with Dr. Erling Cruz., who imaged the patient twice and found that they were thoracic and upper cervical spinal cord signals ( in the cervical C 2-5 and upper thoracic cord Th 2 ) he also repeated the CSF tested and found now two oligoclonal bands.  The patient never experienced clinical relapsing remitting disorder , had never an optic neuritis and thus a question of a primary progressive MS disease was also raised- because followup MRI of the brain the last in 2013 failed to document demyelinating lesions again . An antibody for neuromyelitis optica ( NMO - Devic's) was not found.  The patient has quite significant scoliosis of the spine and has presented with a 37 curving, she has no pain associated with the back right now .  Her spinal degenerative disease and other joint diseases can be attributed to her posture and abnormal shape of the spinal column.  She continues to exercise and does water exercises.  She has noticed lower extremity progressive  weakness. 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 As I am less sure of dealing with multiple sclerosis in this patient,  I had ordered an immune disease panel which included an ANA., and which again returned positive.  A total creatinine kinase was a normal range of 132 units per liter, but her CK-MB was elevated at 6.2.  Ds DNA test was negative , and Sjogren's  DNA test returned positive for SSA Ro and SSB -L A. Antibody at a more than 8 times the normal titer. Double-stranded DNA was negative.  I would like Dr. Amil Amen and Dr. Inda Merlin to evaluate, if this patient may have another autoimmune disorder syndrome or a systemic form of Lupus and not just the cutaneus form of lupus.  She shows no signs of scleroderma , but I have not tested her for polymyositis or dermatomyositis.  I would like to add a steroid taper to Actonel to see if the patient's myalgia or and muscle atrophy would respond. I have also discussed with the patient when the best time for a trial like this would be. Since she is preparing to have hip surgery, I would like to give her at least a two-week prednisone treatment prior. I hope that this will limit the stiffness , and helps her to recover quicker also couldn't tolerate physical therapy and occupational therapy after surgery.      REVIEW OF SYSTEMS: Full 14 system review of systems performed and notable only for:  Constitutional: Fatigue Eyes: Light sensitivity, loss of vision, blurred vision Ear/Nose/Throat: Trouble swallowing  Skin: Itching  Cardiovascular: N/A  Respiratory: N/A  Gastrointestinal: Constipation  Genitourinary: Incontinence of bladder, frequency of urination, urgency Hematology/Lymphatic: Bruise/bleed easily Endocrine: Cold intolerance Musculoskeletal: Joint pain, joint swelling, back pain, aching muscles, walking difficulty, neck pain  Allergy/Immunology: N/A  Neurological: Numbness, weakness Psychiatric: N/A Sleep: Insomnia, frequent waking   ALLERGIES: Allergies  Allergen Reactions  . Latex Rash    Peels her skin  . Percocet [Oxycodone-Acetaminophen] Nausea Only and Rash  . Sulfa Antibiotics Nausea Only and Rash  . Macrobid [Nitrofurantoin] Nausea Only  . Tape Rash    Tears skin    HOME MEDICATIONS: Outpatient Prescriptions Prior to Visit  Medication Sig Dispense Refill  .  benazepril-hydrochlorthiazide (LOTENSIN HCT) 20-25 MG per tablet Take 1 tablet by mouth every morning.      . calcium carbonate 200 MG capsule Take 200 mg by mouth daily.       . cholecalciferol (VITAMIN D) 1000 UNITS tablet Take 1,000 Units by mouth daily.      . clobetasol cream (TEMOVATE) 0.27 % Apply 1 application topically 2 (two) times daily.       . Cyanocobalamin (VITAMIN B 12 PO) Take 1,000 mcg by mouth daily.       . fesoterodine (TOVIAZ) 4 MG TB24 Take 4 mg by mouth daily.      . modafinil (PROVIGIL) 200 MG tablet TAKE 1 TABLET BY MOUTH DAILY  90 tablet  0  . naproxen (NAPROSYN) 500 MG tablet TAKE 1 TABLET BY MOUTH TWICE DAILY WITH A MEAL  30 tablet  0  . NEURONTIN 300 MG capsule TAKE 1 CAPSULE BY MOUTH THREE TIMES DAILY  30 capsule  0  . polyethylene glycol (MIRALAX / GLYCOLAX) packet Take 17 g by mouth daily as needed.  14 each  0  . vitamin E 400 UNIT capsule Take 400 Units by mouth daily.      Marland Kitchen docusate sodium  100 MG CAPS Take 100 mg by mouth 2 (two) times daily.  10 capsule  0  . ferrous sulfate 325 (65 FE) MG tablet Take 1 tablet (325 mg total) by mouth 3 (three) times daily after meals.    3  . HYDROcodone-acetaminophen (NORCO) 7.5-325 MG per tablet Take 1-2 tablets by mouth every 4 (four) hours as needed for pain.  80 tablet  0  . tiZANidine (ZANAFLEX) 4 MG capsule Take 1 capsule (4 mg total) by mouth 3 (three) times daily as needed for muscle spasms.  50 capsule  0   No facility-administered medications prior to visit.    PAST MEDICAL HISTORY: Past Medical History  Diagnosis Date  . Multiple sclerosis   . Degenerative joint disease   . Bursitis   . Osteoarthritis   . AC (acromioclavicular) joint bone spurs   . Lupus     hx. subcutaneous lupus.  . Goiter, nontoxic, multinodular   . Skin cancer   . Chronic pain   . High cholesterol   . Hypertension   . Scoliosis   . Arthritis   . Torn meniscus 1/14    left knee  . Autoimmune disease, not elsewhere  classified(279.49) 08/04/2013    Lupus cutaneus,  With SLE type serology - and oligoclonal bands, negative brain MRI .   Marland Kitchen PONV (postoperative nausea and vomiting)     PAST SURGICAL HISTORY: Past Surgical History  Procedure Laterality Date  . Knee surgery Left 09-01-13    1'14  . Tonsillectomy    . Childbirth  [id number]    admissions  . Dilation and curettage of uterus    . Skin cancer excision      right nasal bridge  . Total hip arthroplasty Left 09/08/2013    Procedure: LEFT TOTAL HIP ARTHROPLASTY ANTERIOR APPROACH;  Surgeon: Mauri Pole, MD;  Location: [facility];  Service: Orthopedics;  Laterality: Left;    FAMILY HISTORY: Family History  Problem Relation Age of Onset  . Heart disease Mother   . Cancer Other     SOCIAL HISTORY: History   Social History  . Marital Status: Single    Spouse Name: N/A    Number of Children: 2  . Years of Education: N/A   Occupational History  . plan administer     [employer]   Social History Main Topics  . Smoking status: Never Smoker   . Smokeless tobacco: Not on file  . Alcohol Use: No  . Drug Use: No  . Sexual Activity: Yes   Other Topics Concern  . Not on file   Social History Narrative  . No narrative on file      PHYSICAL EXAM  Filed Vitals:   08/11/14 1332  Weight: 161 lb (73.029 kg)   Body mass index is 29.44 kg/(m^2).  Generalized: Well developed, in no acute distress   Neurological examination  Mentation: Alert oriented to time, place, history taking. Follows all commands speech and language fluent Cranial nerve II-XII: Pupils were equal round reactive to light. Extraocular movements were full, visual field were full on confrontational test. Facial sensation and strength were normal.  Uvula tongue midline. Head turning and shoulder shrug  were normal and symmetric. Motor: The motor testing reveals 5 over 5 strength of all 4 extremities. Good symmetric motor tone is noted throughout.  Sensory: Sensory  testing is intact to soft touch on all 4 extremities. No evidence of extinction is noted.  Coordination: Cerebellar testing reveals good finger-nose-finger  and heel-to-shin bilaterally.  Gait and station: Slightly stooped posture. Shoulders are uneven- right shoulder higher than left. Tandem gait is slightly unsteady Romberg is negative. No drift is seen.  Reflexes: Deep tendon reflexes are symmetric and normal bilaterally.    DIAGNOSTIC DATA (LABS, IMAGING, TESTING) - I reviewed patient records, labs, notes, testing and imaging myself where available.  Lab Results  Component Value Date   WBC 8.5 09/09/2013   HGB 9.2* 09/09/2013   HCT 27.3* 09/09/2013   MCV 88.6 09/09/2013   PLT 203 09/09/2013      Component Value Date/Time   NA 135 09/09/2013 0522   K 3.4* 09/09/2013 0522   CL 104 09/09/2013 0522   CO2 25 09/09/2013 0522   GLUCOSE 110* 09/09/2013 0522   BUN 15 09/09/2013 0522   CREATININE 0.81 09/09/2013 0522   CALCIUM 8.3* 09/09/2013 0522   GFRNONAA 73* 09/09/2013 0522   GFRAA 85* 09/09/2013 0522       ASSESSMENT AND PLAN 68 y.o. year old female  has a past medical history of Multiple sclerosis; Degenerative joint disease; Bursitis; Osteoarthritis; AC (acromioclavicular) joint bone spurs; Lupus; Goiter, nontoxic, multinodular; Skin cancer; Chronic pain; High cholesterol; Hypertension; Scoliosis; Arthritis; Torn meniscus (1/14); Autoimmune disease, not elsewhere classified(279.49) (08/04/2013); and PONV (postoperative nausea and vomiting). here with:  1. Multiple sclerosis 2. Cutaneous lupus 3. Dry eyes, dry mouth  It doesn't appear that the patient has had any progression of her MS. She does have some difficulty with gait however I think that has do to her scoliosis and kyphosis. She also complains of some shoulder pain but again I believe this is do to her kyphosis. She does have some new symptoms of dry eyes and dry mouth and nasal congestion. She states that she feels that  something is stuck in her nose. She has been to the ENT and their exam was unremarkable. I will check blood work today looking for possible causes. She does report some chronic fatigue which could be due to the MS. She continues to have the numbness and tingling in the lower extremities up to the waist.She has been using gabapentin and naproxen. I will refill this today. She also uses modafinil for fatigue. The patient has not had repeat a MRI of her brain, cervical, thoracic spine since 2012. I will repeat dose today to look for progression of her MS. The patient should followup in 6 months with Dr. Brett Fairy.    Ward Givens, MSN, NP-C 08/11/2014, 1:37 PM [facility] [address], [place], [place] [address] [phone]  Note: This document was prepared with digital dictation and possible smart phrase technology. Any transcriptional errors that result from this process are unintentional.

## 2014-08-11 NOTE — Patient Instructions (Signed)

## 2014-08-11 NOTE — Progress Notes (Signed)
I agree with the assessment and plan as directed by NP .The patient is known to me .   Kailee Essman, MD  

## 2014-08-12 ENCOUNTER — Telehealth: Payer: Self-pay | Admitting: Adult Health

## 2014-08-12 DIAGNOSIS — H04123 Dry eye syndrome of bilateral lacrimal glands: Secondary | ICD-10-CM

## 2014-08-12 LAB — CBC WITH DIFFERENTIAL
Basophils Absolute: 0 10*3/uL (ref 0.0–0.2)
Basos: 0 %
Eos: 1 %
Eosinophils Absolute: 0.1 10*3/uL (ref 0.0–0.4)
HEMATOCRIT: 37 % (ref 34.0–46.6)
Hemoglobin: 12.3 g/dL (ref 11.1–15.9)
IMMATURE GRANS (ABS): 0 10*3/uL (ref 0.0–0.1)
Immature Granulocytes: 0 %
LYMPHS: 26 %
Lymphocytes Absolute: 1.3 10*3/uL (ref 0.7–3.1)
MCH: 30.4 pg (ref 26.6–33.0)
MCHC: 33.2 g/dL (ref 31.5–35.7)
MCV: 92 fL (ref 79–97)
MONOCYTES: 11 %
Monocytes Absolute: 0.5 10*3/uL (ref 0.1–0.9)
NEUTROS PCT: 62 %
Neutrophils Absolute: 3 10*3/uL (ref 1.4–7.0)
Platelets: 280 10*3/uL (ref 150–379)
RBC: 4.04 x10E6/uL (ref 3.77–5.28)
RDW: 13.8 % (ref 12.3–15.4)
WBC: 4.9 10*3/uL (ref 3.4–10.8)

## 2014-08-12 LAB — COMPREHENSIVE METABOLIC PANEL
ALBUMIN: 4 g/dL (ref 3.6–4.8)
ALT: 16 IU/L (ref 0–32)
AST: 32 IU/L (ref 0–40)
Albumin/Globulin Ratio: 0.9 — ABNORMAL LOW (ref 1.1–2.5)
Alkaline Phosphatase: 129 IU/L — ABNORMAL HIGH (ref 39–117)
BUN/Creatinine Ratio: 21 (ref 11–26)
BUN: 24 mg/dL (ref 8–27)
CHLORIDE: 98 mmol/L (ref 97–108)
CO2: 28 mmol/L (ref 18–29)
Calcium: 9.7 mg/dL (ref 8.7–10.3)
Creatinine, Ser: 1.17 mg/dL — ABNORMAL HIGH (ref 0.57–1.00)
GFR calc Af Amer: 55 mL/min/{1.73_m2} — ABNORMAL LOW (ref >59)
GFR calc non Af Amer: 48 mL/min/{1.73_m2} — ABNORMAL LOW (ref >59)
Globulin, Total: 4.4 g/dL (ref 1.5–4.5)
Glucose: 81 mg/dL (ref 65–99)
POTASSIUM: 3.8 mmol/L (ref 3.5–5.2)
Sodium: 139 mmol/L (ref 134–144)
Total Bilirubin: 0.2 mg/dL (ref 0.0–1.2)
Total Protein: 8.4 g/dL (ref 6.0–8.5)

## 2014-08-12 LAB — SJOGREN'S SYNDROME ANTIBODS(SSA + SSB)

## 2014-08-12 NOTE — Telephone Encounter (Signed)
I called the patient with lab results. She did not answer, I left a voicemail for her to call the office.

## 2014-08-12 NOTE — Telephone Encounter (Signed)
Results faxed to Dr Renda Rolls.

## 2014-08-12 NOTE — Telephone Encounter (Signed)
I went over the lab work with the patient. Her ssa and ssb was positive. I will refer her to rheumatology. She would also like her results faxed to Dr. Lynda Rainwater- dermatologist.

## 2014-08-18 ENCOUNTER — Telehealth: Payer: Self-pay | Admitting: Neurology

## 2014-08-18 NOTE — Telephone Encounter (Signed)
[place] [facility] calling to state that they need patient's labs from the week of 9/11 faxed over to them at [phone].

## 2014-08-19 ENCOUNTER — Encounter: Payer: Self-pay | Admitting: Neurology

## 2014-08-20 NOTE — Telephone Encounter (Signed)
Labs have been faxed over

## 2014-08-30 DIAGNOSIS — E042 Nontoxic multinodular goiter: Secondary | ICD-10-CM | POA: Diagnosis not present

## 2014-09-01 DIAGNOSIS — E042 Nontoxic multinodular goiter: Secondary | ICD-10-CM | POA: Diagnosis not present

## 2014-09-06 ENCOUNTER — Ambulatory Visit
Admission: RE | Admit: 2014-09-06 | Discharge: 2014-09-06 | Disposition: A | Discharge status: Home / Self Care | Payer: Medicare Other | Source: Ambulatory Visit | Attending: Adult Health | Admitting: Adult Health

## 2014-09-06 DIAGNOSIS — R682 Dry mouth, unspecified: Secondary | ICD-10-CM

## 2014-09-06 DIAGNOSIS — G35 Multiple sclerosis: Secondary | ICD-10-CM | POA: Diagnosis not present

## 2014-09-06 DIAGNOSIS — H04123 Dry eye syndrome of bilateral lacrimal glands: Secondary | ICD-10-CM

## 2014-09-07 ENCOUNTER — Ambulatory Visit
Admission: RE | Admit: 2014-09-07 | Discharge: 2014-09-07 | Disposition: A | Discharge status: Home / Self Care | Payer: Medicare Other | Source: Ambulatory Visit | Attending: Adult Health | Admitting: Adult Health

## 2014-09-07 DIAGNOSIS — G35 Multiple sclerosis: Secondary | ICD-10-CM

## 2014-09-07 DIAGNOSIS — H04123 Dry eye syndrome of bilateral lacrimal glands: Secondary | ICD-10-CM

## 2014-09-07 DIAGNOSIS — R682 Dry mouth, unspecified: Secondary | ICD-10-CM

## 2014-09-08 ENCOUNTER — Ambulatory Visit
Admission: RE | Admit: 2014-09-08 | Discharge: 2014-09-08 | Disposition: A | Discharge status: Home / Self Care | Payer: Medicare Other | Source: Ambulatory Visit | Attending: Adult Health | Admitting: Adult Health

## 2014-09-08 DIAGNOSIS — H04123 Dry eye syndrome of bilateral lacrimal glands: Secondary | ICD-10-CM

## 2014-09-08 DIAGNOSIS — G35 Multiple sclerosis: Secondary | ICD-10-CM

## 2014-09-08 DIAGNOSIS — R682 Dry mouth, unspecified: Secondary | ICD-10-CM

## 2014-09-09 ENCOUNTER — Telehealth: Payer: Self-pay | Admitting: Adult Health

## 2014-09-09 NOTE — Telephone Encounter (Signed)
I called the patient in regards to her MRI results. I left a message for her to call our office.

## 2014-09-10 ENCOUNTER — Telehealth: Payer: Self-pay | Admitting: Adult Health

## 2014-09-10 DIAGNOSIS — M4802 Spinal stenosis, cervical region: Secondary | ICD-10-CM

## 2014-09-10 NOTE — Telephone Encounter (Signed)
Patient returning Michele Blossom, NP call regarding MRI results.

## 2014-09-10 NOTE — Telephone Encounter (Signed)
I called the patient. I reviewed the MRI with the patient. She does have moderate to severe stenosis in the neck. She also has scoliosis.  She does have some trouble with her gait. She states she is unsure if it is all completely associated with scoliosis.The patient would like to be referred to a neurosurgeon to have then evaluate the scan as well. I will put the referral in.

## 2014-09-10 NOTE — Telephone Encounter (Signed)
I called the patient's home and cell number. I left a message for her to call the office regarding her MRI results. If I do not hear from her today, I will try to reach her again Monday.

## 2014-09-22 DIAGNOSIS — M4802 Spinal stenosis, cervical region: Secondary | ICD-10-CM | POA: Diagnosis not present

## 2014-09-22 DIAGNOSIS — Z6830 Body mass index (BMI) 30.0-30.9, adult: Secondary | ICD-10-CM | POA: Diagnosis not present

## 2014-09-26 DIAGNOSIS — M3500 Sicca syndrome, unspecified: Secondary | ICD-10-CM

## 2014-09-26 HISTORY — DX: Sjogren syndrome, unspecified: M35.00

## 2014-09-30 ENCOUNTER — Other Ambulatory Visit: Payer: Self-pay | Admitting: Neurology

## 2014-09-30 NOTE — Telephone Encounter (Signed)
Rx signed and faxed.

## 2014-10-04 DIAGNOSIS — E780 Pure hypercholesterolemia: Secondary | ICD-10-CM | POA: Diagnosis not present

## 2014-10-04 DIAGNOSIS — I10 Essential (primary) hypertension: Secondary | ICD-10-CM | POA: Diagnosis not present

## 2014-10-05 ENCOUNTER — Telehealth: Payer: Self-pay | Admitting: Neurology

## 2014-10-05 ENCOUNTER — Encounter: Payer: Self-pay | Admitting: Neurology

## 2014-10-05 NOTE — Telephone Encounter (Signed)
Printed and mailed letter with adjusted appointment time on 02/09/15 per Dr. Edwena Felty schedule.

## 2014-10-06 DIAGNOSIS — N289 Disorder of kidney and ureter, unspecified: Secondary | ICD-10-CM | POA: Diagnosis not present

## 2014-10-11 ENCOUNTER — Telehealth: Payer: Self-pay | Admitting: Neurology

## 2014-10-11 NOTE — Telephone Encounter (Signed)
Dalene Seltzer with [facility] @ [phone] wanted to inform office, Rx request for NEURONTIN 300 MG capsule has not been approved per Medical Director.  Requesting Non FDA diagnosis and our office will receive letter with Appeal Instructions.  Patient is aware.

## 2014-10-13 DIAGNOSIS — M25561 Pain in right knee: Secondary | ICD-10-CM | POA: Diagnosis not present

## 2014-10-13 DIAGNOSIS — M79641 Pain in right hand: Secondary | ICD-10-CM | POA: Diagnosis not present

## 2014-10-13 DIAGNOSIS — M419 Scoliosis, unspecified: Secondary | ICD-10-CM | POA: Diagnosis not present

## 2014-10-13 DIAGNOSIS — M25562 Pain in left knee: Secondary | ICD-10-CM | POA: Diagnosis not present

## 2014-10-13 DIAGNOSIS — M25571 Pain in right ankle and joints of right foot: Secondary | ICD-10-CM | POA: Diagnosis not present

## 2014-10-13 DIAGNOSIS — M79642 Pain in left hand: Secondary | ICD-10-CM | POA: Diagnosis not present

## 2014-10-13 DIAGNOSIS — M25572 Pain in left ankle and joints of left foot: Secondary | ICD-10-CM | POA: Diagnosis not present

## 2014-10-15 DIAGNOSIS — R5381 Other malaise: Secondary | ICD-10-CM | POA: Diagnosis not present

## 2014-10-15 DIAGNOSIS — R300 Dysuria: Secondary | ICD-10-CM | POA: Diagnosis not present

## 2014-10-15 DIAGNOSIS — Z79899 Other long term (current) drug therapy: Secondary | ICD-10-CM | POA: Diagnosis not present

## 2014-10-15 DIAGNOSIS — M2550 Pain in unspecified joint: Secondary | ICD-10-CM | POA: Diagnosis not present

## 2014-10-15 DIAGNOSIS — Z113 Encounter for screening for infections with a predominantly sexual mode of transmission: Secondary | ICD-10-CM | POA: Diagnosis not present

## 2014-10-28 DIAGNOSIS — L932 Other local lupus erythematosus: Secondary | ICD-10-CM | POA: Diagnosis not present

## 2014-10-28 DIAGNOSIS — I129 Hypertensive chronic kidney disease with stage 1 through stage 4 chronic kidney disease, or unspecified chronic kidney disease: Secondary | ICD-10-CM | POA: Diagnosis not present

## 2014-10-28 DIAGNOSIS — N179 Acute kidney failure, unspecified: Secondary | ICD-10-CM | POA: Diagnosis not present

## 2014-10-28 DIAGNOSIS — Z79899 Other long term (current) drug therapy: Secondary | ICD-10-CM | POA: Diagnosis not present

## 2014-10-28 DIAGNOSIS — I10 Essential (primary) hypertension: Secondary | ICD-10-CM | POA: Diagnosis not present

## 2014-11-01 DIAGNOSIS — M419 Scoliosis, unspecified: Secondary | ICD-10-CM | POA: Diagnosis not present

## 2014-11-01 DIAGNOSIS — L932 Other local lupus erythematosus: Secondary | ICD-10-CM | POA: Diagnosis not present

## 2014-11-01 DIAGNOSIS — R899 Unspecified abnormal finding in specimens from other organs, systems and tissues: Secondary | ICD-10-CM | POA: Diagnosis not present

## 2014-11-01 DIAGNOSIS — M170 Bilateral primary osteoarthritis of knee: Secondary | ICD-10-CM | POA: Diagnosis not present

## 2014-11-04 ENCOUNTER — Telehealth: Payer: Self-pay | Admitting: Oncology

## 2014-11-04 NOTE — Telephone Encounter (Signed)
LEFT MESSAGE FOR PATIENT TO RETURN CALL TO SCHEDULE NP APPT.  °

## 2014-11-05 ENCOUNTER — Telehealth: Payer: Self-pay | Admitting: Oncology

## 2014-11-05 NOTE — Telephone Encounter (Signed)
LEFT MESSAGE FOR PATIENT TO RETURN CALL TO SCHEDULE NP APPT,

## 2014-11-08 ENCOUNTER — Telehealth: Payer: Self-pay | Admitting: Neurology

## 2014-11-08 NOTE — Telephone Encounter (Signed)
Michele Walton with [facility] @ [phone], confirmed receipt of Appeal, take 7 days for turn around time and will contact our office with status update.  FYI

## 2014-11-09 NOTE — Telephone Encounter (Signed)
Michele Walton calling back from [facility] and needs additional Dx's codes if there are any.  Please call advise.

## 2014-11-09 NOTE — Telephone Encounter (Signed)
I called back and spoke with Manuela Schwartz.  Provided all info requested.  Currently under review.

## 2014-11-11 ENCOUNTER — Telehealth: Payer: Self-pay | Admitting: Oncology

## 2014-11-11 NOTE — Telephone Encounter (Signed)
Lt mess for pt to for appt 11/23/14 at 1:30pm [facility]

## 2014-11-12 ENCOUNTER — Telehealth: Payer: Self-pay | Admitting: Oncology

## 2014-11-12 DIAGNOSIS — R3915 Urgency of urination: Secondary | ICD-10-CM | POA: Diagnosis not present

## 2014-11-12 DIAGNOSIS — N3281 Overactive bladder: Secondary | ICD-10-CM | POA: Diagnosis not present

## 2014-11-12 DIAGNOSIS — N179 Acute kidney failure, unspecified: Secondary | ICD-10-CM | POA: Diagnosis not present

## 2014-11-12 NOTE — Telephone Encounter (Signed)
PT CALLED TO R/S NEW PT APPT FROM 12/29 TO 11/30/14. PER PT 12/29 IS THE ONLY DAY SHE CANNOT COME. NEXT AVAILABLE WAS 11/30/14. PT HAS NEW D/T FOR FC/FS 11/30/14 @ 1:30PM.

## 2014-11-23 ENCOUNTER — Ambulatory Visit: Payer: Medicare Other | Admitting: Oncology

## 2014-11-23 ENCOUNTER — Ambulatory Visit: Payer: Medicare Other

## 2014-11-30 ENCOUNTER — Encounter: Payer: Self-pay | Admitting: Oncology

## 2014-11-30 ENCOUNTER — Telehealth: Payer: Self-pay | Admitting: Oncology

## 2014-11-30 ENCOUNTER — Ambulatory Visit: Payer: Medicare Other

## 2014-11-30 ENCOUNTER — Ambulatory Visit (HOSPITAL_BASED_OUTPATIENT_CLINIC_OR_DEPARTMENT_OTHER): Payer: Medicare Other | Admitting: Oncology

## 2014-11-30 VITALS — BP 122/51 | HR 96 | Temp 98.1°F | Resp 18 | Ht 62.0 in | Wt 150.1 lb

## 2014-11-30 DIAGNOSIS — D729 Disorder of white blood cells, unspecified: Secondary | ICD-10-CM

## 2014-11-30 NOTE — Telephone Encounter (Signed)
gv and printed appt sched and avs fo rpt for July °

## 2014-11-30 NOTE — Consult Note (Signed)
Reason for Referral: Elevated immunoglobulin.   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 She does have a history of multiple sclerosis, severe arthritis, cutaneous lupus among other comorbid conditions. Patient was evaluated on a regular basis by neurology and was referred to rheumatology for evaluation for possible Sjogren's disease. She had symptoms of dry mouth and dry eyes. She was evaluated by Dr. Estanislado Pandy and a laboratory panel was sent for a comprehensive workup. He was found to have an elevated IgG level of 2830. Her IgA and IgM all normal. Her serum protein electrophoresis did not detect a monoclonal protein. She had a positive rheumatoid factor, a positive ANA as well as positive antibody for SSA and SSB. She found to have increased increased creatinine recently and was also evaluated by renal medicine. Clinically, she is completely asymptomatic at this point from the standpoint. She does have chronic pain related to her scoliosis and arthritis. But had not had any pathological fractures or worsening neuropathy. She did not have an opportunistic infections or constitutional symptoms. She does not report any headaches, blurry vision, syncope or seizures. She does not report any fevers, chills, sweats or weight loss. Her appetite had been reasonable and have declined recently related to dental work. She does not report any chest pain, palpitation or orthopnea. Does not report any cough, hemoptysis or hematemesis. She does not report any nausea, vomiting, abdominal pain or change in her bowel habits. She does not report any frequency, urgency or hematuria or dysuria. She does not report any lymphadenopathy or petechiae. She does not report any mood disorder such as depression or anxiety. Rest of her review of systems unremarkable.   Past Medical History  Diagnosis Date  . Multiple sclerosis   . Degenerative joint disease   .  Bursitis   . Osteoarthritis   . AC (acromioclavicular) joint bone spurs   . Lupus     hx. subcutaneous lupus.  . Goiter, nontoxic, multinodular   . Skin cancer   . Chronic pain   . High cholesterol   . Hypertension   . Scoliosis   . Arthritis   . Torn meniscus 1/14    left knee  . 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   Marland Kitchen PONV (postoperative nausea and vomiting)   :  Past Surgical History  Procedure Laterality Date  . Knee surgery Left 09-01-13    1'14  . Tonsillectomy    . Childbirth  7829,5621    admissions  . Dilation and curettage of uterus    . Skin cancer excision      right nasal bridge  . Total hip arthroplasty Left 09/08/2013    Procedure: LEFT TOTAL HIP ARTHROPLASTY ANTERIOR APPROACH;  Surgeon: Mauri Pole, MD;  Location: [facility];  Service: Orthopedics;  Laterality: Left;  :  Current Outpatient Prescriptions  Medication Sig Dispense Refill  . aspirin EC 81 MG tablet Take 81 mg by mouth daily.    . benazepril-hydrochlorthiazide (LOTENSIN HCT) 20-25 MG per tablet Take 1 tablet by mouth every morning.    . calcium carbonate 200 MG capsule Take 200 mg by mouth daily.     . cholecalciferol (VITAMIN D) 1000 UNITS tablet Take 1,000 Units by mouth daily.    . Cyanocobalamin (VITAMIN B 12 PO) Take 1,000 mcg by mouth daily.     Marland Kitchen  fesoterodine (TOVIAZ) 4 MG TB24 Take 4 mg by mouth daily.    . hydroxychloroquine (PLAQUENIL) 200 MG tablet Take by mouth.    . modafinil (PROVIGIL) 200 MG tablet TAKE 1 TABLET BY MOUTH EVERY DAY 90 tablet 1  . Multiple Vitamin (MULTI-VITAMINS) TABS Take by mouth.    . NEURONTIN 300 MG capsule TAKE 1 CAPSULE BY MOUTH THREE TIMES DAILY 270 capsule 3  . polyethylene glycol (MIRALAX / GLYCOLAX) packet Take 17 g by mouth daily as needed. 14 each 0  . vitamin E 400 UNIT capsule Take 400 Units by mouth daily.     No current  facility-administered medications for this visit.       Allergies  Allergen Reactions  . Latex Rash    Peels her skin  . Percocet [Oxycodone-Acetaminophen] Nausea Only and Rash  . Sulfa Antibiotics Nausea Only and Rash  . Macrobid [Nitrofurantoin] Nausea Only  . Tape Rash    Tears skin  :  Family History  Problem Relation Age of Onset  . Heart disease Mother   . Cancer Other   :  History   Social History  . Marital Status: Single    Spouse Name: N/A    Number of Children: 2  . Years of Education: Bachelor   Occupational History  . plan administer     [employer]   Social History Main Topics  . Smoking status: Never Smoker   . Smokeless tobacco: Never Used  . Alcohol Use: No  . Drug Use: No  . Sexual Activity: Yes   Other Topics Concern  . Not on file   Social History Narrative   Patient is single with 2 children.   Patient is right handed   Patient has a Bachelor's degree.   Patient drinks 1 cup daily.  :  Pertinent items are noted in HPI.  Exam: ECOG 0 Blood pressure 122/51, pulse 96, temperature 98.1 F (36.7 C), temperature source Oral, resp. rate 18, height 5\' 2"  (1.575 m), weight 150 lb 1.6 oz (68.085 kg), SpO2 100 %. General appearance: alert and cooperative Throat: lips, mucosa, and tongue normal; teeth and gums normal Neck: no adenopathy Back: negative Resp: clear to auscultation bilaterally Chest wall: no tenderness Cardio: regular rate and rhythm, S1, S2 normal, no murmur, click, rub or gallop GI: soft, non-tender; bowel sounds normal; no masses,  no organomegaly Extremities: extremities normal, atraumatic, no cyanosis or edema Pulses: 2+ and symmetric Skin: Skin color, texture, turgor normal. No rashes or lesions Lymph nodes: Cervical, supraclavicular, and axillary nodes normal.  CBC    Component Value Date/Time   WBC 4.9 08/11/2014 1425   WBC 8.5 09/09/2013 0522   RBC 4.04 08/11/2014 1425   RBC 3.08* 09/09/2013 0522   HGB  12.3 08/11/2014 1425   HCT 37.0 08/11/2014 1425   PLT 280 08/11/2014 1425   MCV 92 08/11/2014 1425   MCH 30.4 08/11/2014 1425   MCH 29.9 09/09/2013 0522   MCHC 33.2 08/11/2014 1425   MCHC 33.7 09/09/2013 0522   RDW 13.8 08/11/2014 1425   RDW 13.2 09/09/2013 0522   LYMPHSABS 1.3 08/11/2014 1425   EOSABS 0.1 08/11/2014 1425   BASOSABS 0.0 08/11/2014 1425      Chemistry      Component Value Date/Time   NA 139 08/11/2014 1425   NA 135 09/09/2013 0522   K 3.8 08/11/2014 1425   CL 98 08/11/2014 1425   CO2 28 08/11/2014 1425   BUN 24 08/11/2014 1425  BUN 15 09/09/2013 0522   CREATININE 1.17* 08/11/2014 1425      Component Value Date/Time   CALCIUM 9.7 08/11/2014 1425   ALKPHOS 129* 08/11/2014 1425   AST 32 08/11/2014 1425   ALT 16 08/11/2014 1425   BILITOT 0.2 08/11/2014 1425       Assessment and Plan:   69 year old woman with elevated IgG and a normal IgA and IgM. Her serum protein electrophoresis did not show a monoclonal protein. The differential diagnosis was discussed with the patient today which include autoimmune disease versus plasma cell disorder. I doubt that she has a plasma cell disorder at this time. I see no monoclonal protein to suggest that on her serum protein electrophoresis. She did have an extensive imaging studies including x-rays and MRIs for her multiple sclerosis without any bone lesions.  The most likely explanation for her elevated sedimentation rate as well as her elevated immunoglobulins as autoimmune disease. She is suspected to have Sjogren's disease and she has documented cutaneous lupus. I think these are adequate explanation for her elevated immunoglobulins as well as sedimentation rate related to chronic inflammatory conditions.  For completeness sake, I will repeat a serum protein electrophoresis, quantitative immunoglobulins as well as chemistries in 6 months to ensure the accuracy of this therapy. All questions were answered today to her  satisfaction.

## 2014-11-30 NOTE — Progress Notes (Signed)
Please see consult note.  

## 2014-11-30 NOTE — Progress Notes (Signed)
Checked in new pt with no financial concerns at this time.  Pt has 2 insurances and is here for a hematology concern so financial assistance may not be needed but she has my card for any billing or insurance questions or concerns.

## 2014-12-01 DIAGNOSIS — Z124 Encounter for screening for malignant neoplasm of cervix: Secondary | ICD-10-CM | POA: Diagnosis not present

## 2014-12-01 DIAGNOSIS — Z1231 Encounter for screening mammogram for malignant neoplasm of breast: Secondary | ICD-10-CM | POA: Diagnosis not present

## 2014-12-13 DIAGNOSIS — D699 Hemorrhagic condition, unspecified: Secondary | ICD-10-CM | POA: Diagnosis not present

## 2014-12-13 DIAGNOSIS — M170 Bilateral primary osteoarthritis of knee: Secondary | ICD-10-CM | POA: Diagnosis not present

## 2014-12-13 DIAGNOSIS — M5030 Other cervical disc degeneration, unspecified cervical region: Secondary | ICD-10-CM | POA: Diagnosis not present

## 2014-12-13 DIAGNOSIS — M5137 Other intervertebral disc degeneration, lumbosacral region: Secondary | ICD-10-CM | POA: Diagnosis not present

## 2015-01-11 DIAGNOSIS — Z4789 Encounter for other orthopedic aftercare: Secondary | ICD-10-CM | POA: Diagnosis not present

## 2015-01-11 DIAGNOSIS — M25511 Pain in right shoulder: Secondary | ICD-10-CM | POA: Diagnosis not present

## 2015-01-11 DIAGNOSIS — M25561 Pain in right knee: Secondary | ICD-10-CM | POA: Diagnosis not present

## 2015-01-11 DIAGNOSIS — M25512 Pain in left shoulder: Secondary | ICD-10-CM | POA: Diagnosis not present

## 2015-01-31 DIAGNOSIS — L932 Other local lupus erythematosus: Secondary | ICD-10-CM | POA: Diagnosis not present

## 2015-01-31 DIAGNOSIS — I10 Essential (primary) hypertension: Secondary | ICD-10-CM | POA: Diagnosis not present

## 2015-01-31 DIAGNOSIS — Z79899 Other long term (current) drug therapy: Secondary | ICD-10-CM | POA: Diagnosis not present

## 2015-01-31 DIAGNOSIS — N179 Acute kidney failure, unspecified: Secondary | ICD-10-CM | POA: Diagnosis not present

## 2015-02-01 DIAGNOSIS — M4185 Other forms of scoliosis, thoracolumbar region: Secondary | ICD-10-CM | POA: Diagnosis not present

## 2015-02-01 DIAGNOSIS — M542 Cervicalgia: Secondary | ICD-10-CM | POA: Diagnosis not present

## 2015-02-01 DIAGNOSIS — M546 Pain in thoracic spine: Secondary | ICD-10-CM | POA: Diagnosis not present

## 2015-02-02 DIAGNOSIS — L930 Discoid lupus erythematosus: Secondary | ICD-10-CM | POA: Diagnosis not present

## 2015-02-09 ENCOUNTER — Ambulatory Visit: Payer: Medicare Other | Admitting: Neurology

## 2015-02-09 ENCOUNTER — Encounter: Payer: Self-pay | Admitting: Neurology

## 2015-02-09 ENCOUNTER — Ambulatory Visit (INDEPENDENT_AMBULATORY_CARE_PROVIDER_SITE_OTHER): Payer: Medicare Other | Admitting: Neurology

## 2015-02-09 VITALS — BP 124/69 | HR 95 | Resp 18 | Ht 61.0 in | Wt 147.6 lb

## 2015-02-09 DIAGNOSIS — M4802 Spinal stenosis, cervical region: Secondary | ICD-10-CM | POA: Insufficient documentation

## 2015-02-09 DIAGNOSIS — M3500 Sicca syndrome, unspecified: Secondary | ICD-10-CM | POA: Diagnosis not present

## 2015-02-09 DIAGNOSIS — M9981 Other biomechanical lesions of cervical region: Secondary | ICD-10-CM

## 2015-02-09 DIAGNOSIS — M4120 Other idiopathic scoliosis, site unspecified: Secondary | ICD-10-CM | POA: Diagnosis not present

## 2015-02-09 DIAGNOSIS — M4800 Spinal stenosis, site unspecified: Secondary | ICD-10-CM

## 2015-02-09 MED ORDER — GABAPENTIN 300 MG PO CAPS: 300.0000 mg | ORAL_CAPSULE | Freq: Three times a day (TID) | ORAL | Status: DC

## 2015-02-09 MED ORDER — MODAFINIL 200 MG PO TABS: 200.0000 mg | ORAL_TABLET | Freq: Every day | ORAL | Status: DC

## 2015-02-09 NOTE — Progress Notes (Addendum)
PATIENT: Michele Walton DOB: [birth year]  REASON FOR VISIT: follow up HISTORY FROM: patient  HISTORY OF PRESENT ILLNESS: Michele Walton is a 69 year old female with a possible history of Multiple sclerosis. She returns today for follow-up. She returns today for follow-up. Patient states that in the last three months she has had severe dry mouth and dry eyes. She states that her nose has been getting congested and feels like something is stuck way up in her head. She states that her eyes are so dry that sometimes she can't open eyes without putting on some lubrication. When she was first diagnosed with MS in 1993 she had numbness and tingling in the right foot. Over time it progressed up her leg to her groin. She has never been on any disease modifying agents. She was given gabapentin, naprosyn and modafinil to help with her symptoms. She states that it progressed over the years and now she has some numbness and tingling from the waist down. She also has a history of lumbar scoliosis, kyphosis, spinal stenosis, osteoarthritis, bone spur on the left pelvis. In 2013 she was diagnosed with cutaneous Lupus- she sees a dermatologist for this. Since her last visit she has had some progression of her back discomfort unsure if its due to scoliosis/stenosis or MS. She has an incontinent bladder- this is not new. She is having some difficulty walking but thinks it's due to her anatomy. She fell in 2014, she hit her head on the file cabinet. Her face was bruised. She went the [facility] emergency clinic at that time. She does have some chronic fatigue however she pushes herself to continue on.    HISTORY 08/04/13 (CD): Michele Walton has been followed for multiple sclerosis in this office. She had been originally evaluated in November 1994 when she presented for sensory abnormalities That were felt to relate to the spinal cord level at the thoracic eighth vertebrae.  She presented at that time with hyperreflexia on the left.  An MRI of the spinal cord showed what was thought to be demyelinating Plaques.  She had normal MRIs of the brain in followup, normal Evoked visual potentials, and initially CSF was negative for oligoclonal bands. There has always been a question of another autoimmune disease being involved since the patient tested positive for antinuclear antibodies. She was referred to rheumatology and started on Plaquenil. She followed also at [facility] from 2000 on this Dr. Cyndra Numbers will prescribed Betaseron. Later that year, she returned to [facility] and followed up with Dr. Erling Cruz., who imaged the patient twice and found that they were thoracic and upper cervical spinal cord signals ( in the cervical C 2-5 and upper thoracic cord Th 2 ) he also repeated the CSF tested and found now two oligoclonal bands.  The patient never experienced clinical relapsing remitting disorder , had never an optic neuritis and thus a question of a primary progressive MS disease was also raised- because followup MRI of the brain the last in 2013 failed to document demyelinating lesions again . An antibody for neuromyelitis optica ( NMO - Devic's) was not found.  The patient has quite significant scoliosis of the spine and has presented with a 37 curving, she has no pain associated with the back right now .  Her spinal degenerative disease and other joint diseases can be attributed to her posture and abnormal shape of the spinal column.  She continues to exercise and does water exercises.  She has noticed lower extremity progressive  weakness. 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 As I am less sure of dealing with multiple sclerosis in this patient,  I had ordered an immune disease panel which included an ANA., and which again returned positive.  A total creatinine kinase was a normal range of 132 units per liter, but her CK-MB was elevated at 6.2.  Ds DNA test was negative , and Sjogren's  DNA test returned positive for SSA Ro and SSB -L A. Antibody at a more than 8 times the normal titer. Double-stranded DNA was negative.  I would like Dr. Amil Amen and Dr. Inda Merlin to evaluate, if this patient may have another autoimmune disorder syndrome or a systemic form of Lupus and not just the cutaneus form of lupus.  She shows no signs of scleroderma , but I have not tested her for polymyositis or dermatomyositis.  I would like to add a steroid taper to Actonel to see if the patient's myalgia or and muscle atrophy would respond. I have also discussed with the patient when the best time for a trial like this would be. Since she is preparing to have hip surgery, I would like to give her at least a two-week prednisone treatment prior. I hope that this will limit the stiffness , and helps her to recover quicker also couldn't tolerate physical therapy and occupational therapy after surgery.      REVIEW OF SYSTEMS: Full 14 system review of systems performed and notable only for:  Constitutional: Fatigue Eyes: Light sensitivity, loss of vision, blurred vision Ear/Nose/Throat: Trouble swallowing  Skin: Itching  Cardiovascular: N/A  Respiratory: N/A  Gastrointestinal: Constipation  Genitourinary: Incontinence of bladder, frequency of urination, urgency Hematology/Lymphatic: Bruise/bleed easily Endocrine: Cold intolerance Musculoskeletal: Joint pain, joint swelling, back pain, aching muscles, walking difficulty, neck pain  Allergy/Immunology: N/A  Neurological: Numbness, weakness Psychiatric: N/A Sleep: Insomnia, frequent waking   ALLERGIES: Allergies  Allergen Reactions  . Latex Rash    Peels her skin  . Percocet [Oxycodone-Acetaminophen] Nausea Only and Rash  . Sulfa Antibiotics Nausea Only and Rash  . Macrobid [Nitrofurantoin] Nausea Only  . Tape Rash    Tears skin    HOME MEDICATIONS: Outpatient Prescriptions Prior to Visit  Medication Sig Dispense Refill  . aspirin EC 81 MG  tablet Take 81 mg by mouth daily.    . benazepril-hydrochlorthiazide (LOTENSIN HCT) 20-25 MG per tablet Take 1 tablet by mouth every morning.    . calcium carbonate 200 MG capsule Take 200 mg by mouth daily.     . cholecalciferol (VITAMIN D) 1000 UNITS tablet Take 1,000 Units by mouth daily.    . Cyanocobalamin (VITAMIN B 12 PO) Take 1,000 mcg by mouth daily.     . fesoterodine (TOVIAZ) 4 MG TB24 Take 4 mg by mouth daily.    . hydroxychloroquine (PLAQUENIL) 200 MG tablet Take by mouth.    . modafinil (PROVIGIL) 200 MG tablet TAKE 1 TABLET BY MOUTH EVERY DAY 90 tablet 1  . Multiple Vitamin (MULTI-VITAMINS) TABS Take by mouth.    . NEURONTIN 300 MG capsule TAKE 1 CAPSULE BY MOUTH THREE TIMES DAILY 270 capsule 3  . polyethylene glycol (MIRALAX / GLYCOLAX) packet Take 17 g by mouth daily as needed. 14 each 0  . vitamin E 400 UNIT capsule Take 400 Units by mouth daily.     No facility-administered medications prior to visit.    PAST MEDICAL HISTORY: Past Medical History  Diagnosis Date  . Multiple sclerosis   .  Degenerative joint disease   . Bursitis   . Osteoarthritis   . AC (acromioclavicular) joint bone spurs   . Lupus     hx. subcutaneous lupus.  . Goiter, nontoxic, multinodular   . Skin cancer   . Chronic pain   . High cholesterol   . Hypertension   . Scoliosis   . Arthritis   . Torn meniscus 1/14    left knee  . 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   Marland Kitchen PONV (postoperative nausea and vomiting)   . Sjogren's disease 09/2014    PAST SURGICAL HISTORY: Past Surgical History  Procedure Laterality Date  . Knee surgery Left 09-01-13    1'14  . Tonsillectomy    . Childbirth  [id number]    admissions  . Dilation and curettage of uterus    . Skin cancer excision      right nasal bridge  . Total hip arthroplasty Left 09/08/2013    Procedure: LEFT TOTAL HIP  ARTHROPLASTY ANTERIOR APPROACH;  Surgeon: Mauri Pole, MD;  Location: [facility];  Service: Orthopedics;  Laterality: Left;    FAMILY HISTORY: Family History  Problem Relation Age of Onset  . Heart disease Mother   . Cancer Other     SOCIAL HISTORY: History   Social History  . Marital Status: Single    Spouse Name: N/A  . Number of Children: 2  . Years of Education: Bachelor   Occupational History  . plan administer     [employer]   Social History Main Topics  . Smoking status: Never Smoker   . Smokeless tobacco: Never Used  . Alcohol Use: No  . Drug Use: No  . Sexual Activity: Yes   Other Topics Concern  . Not on file   Social History Narrative   Patient is single with 2 children.   Patient is right handed   Patient has a Bachelor's degree.   Patient drinks 1 cup daily.      PHYSICAL EXAM  Filed Vitals:   02/09/15 1506  BP: 124/69  Pulse: 95  Resp: 18  Height: 5\' 1"  (1.549 m)  Weight: 147 lb 9.6 oz (66.951 kg)   Body mass index is 27.9 kg/(m^2).  Generalized: Well developed, in no acute distress   Neurological examination  Mentation: Alert oriented to time, place, history taking. Follows all commands speech and language fluent Cranial nerve II-XII: Pupils were equal round reactive to light. Extraocular movements were full, visual field were full on confrontational test. Facial sensation and strength were normal.  Uvula tongue midline. Head turning and shoulder shrug  were normal and symmetric. Motor: The motor testing reveals 5 over 5 strength of all 4 extremities. Good symmetric motor tone is noted throughout.  Sensory: Sensory testing is intact to soft touch on all 4 extremities. No evidence of extinction is noted.  Coordination: Cerebellar testing reveals good finger-nose-finger and heel-to-shin bilaterally.  Gait and station: Slightly stooped posture. Shoulders are uneven- right shoulder higher than left. Tandem gait is slightly unsteady Romberg  is negative. No drift is seen.  Reflexes: Deep tendon reflexes are symmetric and normal bilaterally.    DIAGNOSTIC DATA (LABS, IMAGING, TESTING) - I reviewed patient records, labs, notes, testing and imaging myself where available.  Lab Results  Component Value Date   WBC 4.9 08/11/2014   HGB 12.3 08/11/2014   HCT 37.0 08/11/2014  MCV 92 08/11/2014   PLT 280 08/11/2014      Component Value Date/Time   NA 139 08/11/2014 1425   NA 135 09/09/2013 0522   K 3.8 08/11/2014 1425   CL 98 08/11/2014 1425   CO2 28 08/11/2014 1425   GLUCOSE 81 08/11/2014 1425   GLUCOSE 110* 09/09/2013 0522   BUN 24 08/11/2014 1425   BUN 15 09/09/2013 0522   CREATININE 1.17* 08/11/2014 1425   CALCIUM 9.7 08/11/2014 1425   PROT 8.4 08/11/2014 1425   AST 32 08/11/2014 1425   ALT 16 08/11/2014 1425   ALKPHOS 129* 08/11/2014 1425   BILITOT 0.2 08/11/2014 1425   GFRNONAA 48* 08/11/2014 1425   GFRAA 55* 08/11/2014 1425       ASSESSMENT AND PLAN   30 minute visit.   1. Presumed Multiple sclerosis by MRI, but negative for oligoclonal bands and clinically not relapsing remitting. Negative for DEVICS by NMO  2. Cutaneous changes, Dry eyes, dry mouth- Sjoergrens syndrome. This may have been the cause of her white mater changes. Dr. Estanislado Pandy prescribed pilocarpine as a tablet, 3 a day po. I would like her to intiate this medication. The patient is concerned because she read about the visual impact and side effect of pedal car.. Pedal And is often used as an eyedrop as well I think that from an oral medication I would be less concerned that she would suffer loss of visual acuity. She is also concerned that she forgets her lunchtime dose. She may want to start just this twice a day to look up in tablets and then have a revisit with Dr. Abram Sander to see an improvement in symptoms had been achieved. She does not feel that bothered by her dry mouth right now, but for her dental health and would be very  important to have a minimum amount of saliva. The patient has crowns but not  dentures.Marland Kitchen  3) scoliosis with progressing back and shoulder problems. Dr. Trenton Gammon.    It doesn't appear that the patient has had any progression of her central nervous system findings;  by MRI all were remote, non acute.   She  Has been diagnosed finally with Sjoegren's, which does explain the  symptoms of dry eyes and dry mouth . She states that she feels that something is stuck in her nose.  She does report some chronic fatigue which could be due to the auto immune process. She continues to have the numbness and tingling in the lower extremities up to the waist. She has been using gabapentin and naproxen. I will refill this today. She also uses modafinil for fatigue.  The patient has had repeat a MRI of her brain, cervical, thoracic spine last 2015. Her MRI was dated 09-06-14 and interpreted by my colleague Dr. Reece Packer mildly. It showed mild scattered periatrial subcortical T2 hyperintensities. Periventricular some of these are oriented perpendicular to the lateral ventricles. The consideration includes chronic multiple sclerosis and other autoimmune or inflammatory processes. The patient does not have signs of microvascular ischemic etiologies given her history. She does not suffer from any vasculitis and she has not had blood clotting disorders DVTs pulmonary emboli etc. I will repeat today that there was no MRI documentation of  progression of her white matter disease, which may not be MS !   The patient's cervical spine MRI from the same date 09-07-14 showed severe by foraminal stenosis at C6 and C7 and moderate stenosis above and below. There were chronic demyelinating plaques  noted at C3 and 4 C7 and T2 these again speak more for MS. Her thoracic spine also showed some chronic demyelinating plaques but no spinal stenosis or foraminal stenosis. I will order EMG and NCV.   She does have some difficulty with gait however  I think that has do to her scoliosis and kyphosis . She also complains of some shoulder pain but again I believe this is dueto her kyphosis. The patient's shoulder is only slightly asymmetric a little lower on the right but she has equal strength of shoulder shrug and a good range of motion for her neck. The scoliosis is most noted at the lower rib cage with a left lower strip now in a seated position is located lower than the pelvic crest. I think this is part of her orthopedic problem and a surgical option should be only the last resort. I encouraged her to try a course that but the patient had another very interesting idea that I find incorrigible. Him a taping procedure to become aware of her posture and to remind her to walk more erect she also noted that when she walks with a walker she attends to be more erect than if she has nothing to lean on. There may be a small proportion of spinal stenosis present but I think that the postural changes achieved either with an"  external skeleton "" or with a taping procedure could be of great benefit to her.    The patient should followup in 6 months with NP      02/09/2015, 3:23 PM [facility] [address], [place], [place] [address] [phone]  Note: This document was prepared with digital dictation and possible smart phrase technology. Any transcriptional errors that result from this process are unintentional.

## 2015-02-09 NOTE — Addendum Note (Signed)
Addended by: Larey Seat on: 02/09/2015 03:55 PM   Modules accepted: Orders

## 2015-03-07 ENCOUNTER — Telehealth: Payer: Self-pay | Admitting: Neurology

## 2015-03-07 ENCOUNTER — Encounter: Payer: Medicare Other | Admitting: Neurology

## 2015-03-07 NOTE — Telephone Encounter (Signed)
This patient did not show for an EMG appointment today.

## 2015-03-08 ENCOUNTER — Encounter: Payer: Self-pay | Admitting: Neurology

## 2015-04-13 ENCOUNTER — Telehealth: Payer: Self-pay | Admitting: Oncology

## 2015-04-13 NOTE — Telephone Encounter (Signed)
Called and left a message with a new dr Alen Blew appointment

## 2015-04-18 DIAGNOSIS — Z79899 Other long term (current) drug therapy: Secondary | ICD-10-CM | POA: Diagnosis not present

## 2015-04-18 DIAGNOSIS — M170 Bilateral primary osteoarthritis of knee: Secondary | ICD-10-CM | POA: Diagnosis not present

## 2015-04-18 DIAGNOSIS — L932 Other local lupus erythematosus: Secondary | ICD-10-CM | POA: Diagnosis not present

## 2015-04-18 DIAGNOSIS — M3500 Sicca syndrome, unspecified: Secondary | ICD-10-CM | POA: Diagnosis not present

## 2015-05-23 ENCOUNTER — Other Ambulatory Visit: Payer: Self-pay

## 2015-05-31 ENCOUNTER — Other Ambulatory Visit (HOSPITAL_BASED_OUTPATIENT_CLINIC_OR_DEPARTMENT_OTHER): Payer: Medicare Other

## 2015-05-31 DIAGNOSIS — D729 Disorder of white blood cells, unspecified: Secondary | ICD-10-CM

## 2015-05-31 DIAGNOSIS — D7289 Other specified disorders of white blood cells: Secondary | ICD-10-CM | POA: Diagnosis not present

## 2015-05-31 LAB — CBC WITH DIFFERENTIAL/PLATELET
BASO%: 0.2 % (ref 0.0–2.0)
Basophils Absolute: 0 10*3/uL (ref 0.0–0.1)
EOS%: 1.7 % (ref 0.0–7.0)
Eosinophils Absolute: 0.1 10*3/uL (ref 0.0–0.5)
HCT: 32 % — ABNORMAL LOW (ref 34.8–46.6)
HGB: 10.8 g/dL — ABNORMAL LOW (ref 11.6–15.9)
LYMPH%: 31.1 % (ref 14.0–49.7)
MCH: 30.9 pg (ref 25.1–34.0)
MCHC: 33.8 g/dL (ref 31.5–36.0)
MCV: 91.7 fL (ref 79.5–101.0)
MONO#: 0.6 10*3/uL (ref 0.1–0.9)
MONO%: 13 % (ref 0.0–14.0)
NEUT#: 2.5 10*3/uL (ref 1.5–6.5)
NEUT%: 54 % (ref 38.4–76.8)
PLATELETS: 221 10*3/uL (ref 145–400)
RBC: 3.49 10*6/uL — AB (ref 3.70–5.45)
RDW: 13.4 % (ref 11.2–14.5)
WBC: 4.6 10*3/uL (ref 3.9–10.3)
lymph#: 1.4 10*3/uL (ref 0.9–3.3)

## 2015-05-31 LAB — COMPREHENSIVE METABOLIC PANEL (CC13)
ALK PHOS: 98 U/L (ref 40–150)
ALT: 15 U/L (ref 0–55)
AST: 30 U/L (ref 5–34)
Albumin: 3.6 g/dL (ref 3.5–5.0)
Anion Gap: 9 mEq/L (ref 3–11)
BUN: 26.5 mg/dL — AB (ref 7.0–26.0)
CO2: 29 mEq/L (ref 22–29)
CREATININE: 1.5 mg/dL — AB (ref 0.6–1.1)
Calcium: 10 mg/dL (ref 8.4–10.4)
Chloride: 99 mEq/L (ref 98–109)
EGFR: 34 mL/min/{1.73_m2} — ABNORMAL LOW (ref >90)
Glucose: 85 mg/dl (ref 70–140)
Potassium: 3.9 mEq/L (ref 3.5–5.1)
Sodium: 137 mEq/L (ref 136–145)
Total Bilirubin: 0.34 mg/dL (ref 0.20–1.20)
Total Protein: 7.9 g/dL (ref 6.4–8.3)

## 2015-06-02 LAB — SPEP & IFE WITH QIG
ALPHA-1-GLOBULIN: 0.3 g/dL (ref 0.2–0.3)
ALPHA-2-GLOBULIN: 0.7 g/dL (ref 0.5–0.9)
Albumin ELP: 3.9 g/dL (ref 3.8–4.8)
Beta 2: 0.4 g/dL (ref 0.2–0.5)
Beta Globulin: 0.5 g/dL (ref 0.4–0.6)
Gamma Globulin: 2.1 g/dL — ABNORMAL HIGH (ref 0.8–1.7)
IGG (IMMUNOGLOBIN G), SERUM: 2260 mg/dL — AB (ref 690–1700)
IgA: 327 mg/dL (ref 69–380)
IgM, Serum: 70 mg/dL (ref 52–322)
TOTAL PROTEIN, SERUM ELECTROPHOR: 7.9 g/dL (ref 6.1–8.1)

## 2015-06-07 ENCOUNTER — Ambulatory Visit: Payer: Medicare Other | Admitting: Oncology

## 2015-06-08 DIAGNOSIS — H5213 Myopia, bilateral: Secondary | ICD-10-CM | POA: Diagnosis not present

## 2015-06-08 DIAGNOSIS — H524 Presbyopia: Secondary | ICD-10-CM | POA: Diagnosis not present

## 2015-06-08 DIAGNOSIS — H52223 Regular astigmatism, bilateral: Secondary | ICD-10-CM | POA: Diagnosis not present

## 2015-06-08 DIAGNOSIS — M3210 Systemic lupus erythematosus, organ or system involvement unspecified: Secondary | ICD-10-CM | POA: Diagnosis not present

## 2015-06-08 DIAGNOSIS — M3500 Sicca syndrome, unspecified: Secondary | ICD-10-CM | POA: Diagnosis not present

## 2015-06-14 ENCOUNTER — Ambulatory Visit: Payer: Medicare Other | Admitting: Oncology

## 2015-06-16 DIAGNOSIS — Z1211 Encounter for screening for malignant neoplasm of colon: Secondary | ICD-10-CM | POA: Diagnosis not present

## 2015-06-16 DIAGNOSIS — K5900 Constipation, unspecified: Secondary | ICD-10-CM | POA: Diagnosis not present

## 2015-06-16 DIAGNOSIS — D509 Iron deficiency anemia, unspecified: Secondary | ICD-10-CM | POA: Diagnosis not present

## 2015-06-30 ENCOUNTER — Ambulatory Visit (HOSPITAL_BASED_OUTPATIENT_CLINIC_OR_DEPARTMENT_OTHER): Payer: Medicare Other | Admitting: Oncology

## 2015-06-30 VITALS — BP 124/46 | HR 73 | Temp 98.1°F | Resp 17 | Ht 61.0 in | Wt 138.9 lb

## 2015-06-30 DIAGNOSIS — M3500 Sicca syndrome, unspecified: Secondary | ICD-10-CM

## 2015-06-30 DIAGNOSIS — D638 Anemia in other chronic diseases classified elsewhere: Secondary | ICD-10-CM

## 2015-06-30 DIAGNOSIS — D649 Anemia, unspecified: Secondary | ICD-10-CM

## 2015-06-30 DIAGNOSIS — R894 Abnormal immunological findings in specimens from other organs, systems and tissues: Secondary | ICD-10-CM

## 2015-06-30 NOTE — Progress Notes (Signed)
Hematology and Oncology Follow Up Visit  Michele Walton [id number] [birth year] 69 y.o. 06/30/2015 10:12 AM Michele Walton, Butch Penny, MD   Principle Diagnosis: 69 year old woman with elevated immunoglobulin G diagnosed in January 2016. No monoclonal protein detected likely reactive in nature.  Current therapy: Observation and surveillance.  Interim History: Michele Walton presents today for a follow-up visit. Since the last visit, she reports no issues. She does have severe arthritis and scoliosis and was diagnosed with Sjogren's syndrome. She has not reported any recent pathological fractures or infections. She continues to ambulate without any major difficulties. Is not report any constitutional symptoms of fevers or chills or sweats.  She does not report any chest pain, palpitation or orthopnea. Does not report any cough, hemoptysis or hematemesis. She does not report any nausea, vomiting, abdominal pain or change in her bowel habits. She does not report any frequency, urgency or hematuria or dysuria. She does not report any lymphadenopathy or petechiae. She does not report any mood disorder such as depression or anxiety. Rest of her review of systems unremarkable.   Medications: I have reviewed the patient's current medications.  Current Outpatient Prescriptions  Medication Sig Dispense Refill  . aspirin EC 81 MG tablet Take 81 mg by mouth daily.    . benazepril-hydrochlorthiazide (LOTENSIN HCT) 20-25 MG per tablet Take 1 tablet by mouth every morning.    . calcium carbonate 200 MG capsule Take 200 mg by mouth daily.     . cholecalciferol (VITAMIN D) 1000 UNITS tablet Take 1,000 Units by mouth daily.    . Cyanocobalamin (VITAMIN B 12 PO) Take 1,000 mcg by mouth daily.     . fesoterodine (TOVIAZ) 4 MG TB24 Take 4 mg by mouth daily.    Marland Kitchen gabapentin (NEURONTIN) 300 MG capsule Take 1 capsule (300 mg total) by mouth 3 (three) times daily. 90 capsule 5  . hydroxychloroquine (PLAQUENIL) 200 MG  tablet Take by mouth.    . modafinil (PROVIGIL) 200 MG tablet Take 1 tablet (200 mg total) by mouth daily. 90 tablet 3  . Multiple Vitamin (MULTI-VITAMINS) TABS Take by mouth.    . NEURONTIN 300 MG capsule TAKE 1 CAPSULE BY MOUTH THREE TIMES DAILY 270 capsule 3  . polyethylene glycol (MIRALAX / GLYCOLAX) packet Take 17 g by mouth daily as needed. 14 each 0  . vitamin E 400 UNIT capsule Take 400 Units by mouth daily.     No current facility-administered medications for this visit.     Allergies:  Allergies  Allergen Reactions  . Latex Rash    Peels her skin  . Percocet [Oxycodone-Acetaminophen] Nausea Only and Rash  . Sulfa Antibiotics Nausea Only and Rash  . Macrobid [Nitrofurantoin] Nausea Only  . Tape Rash    Tears skin    Past Medical History, Surgical history, Social history, and Family History were reviewed and updated.   Physical Exam: Blood pressure 124/46, pulse 73, temperature 98.1 F (36.7 C), temperature source Oral, resp. rate 17, height 5\' 1"  (1.549 m), weight 138 lb 14.4 oz (63.005 kg), SpO2 100 %. ECOG: 1 General appearance: alert and cooperative Head: Normocephalic, without obvious abnormality Neck: no adenopathy and no carotid bruit Lymph nodes: Cervical, supraclavicular, and axillary nodes normal. Heart:regular rate and rhythm, S1, S2 normal, no murmur, click, rub or gallop Lung:chest clear, no wheezing, rales, normal symmetric air entry Abdomin: soft, non-tender, without masses or organomegaly EXT:no erythema, induration, or nodules   Lab Results: Lab Results  Component Value Date   WBC  4.6 05/31/2015   HGB 10.8* 05/31/2015   HCT 32.0* 05/31/2015   MCV 91.7 05/31/2015   PLT 221 05/31/2015     Chemistry      Component Value Date/Time   NA 137 05/31/2015 1309   NA 139 08/11/2014 1425   NA 135 09/09/2013 0522   K 3.9 05/31/2015 1309   K 3.8 08/11/2014 1425   CL 98 08/11/2014 1425   CO2 29 05/31/2015 1309   CO2 28 08/11/2014 1425   BUN 26.5*  05/31/2015 1309   BUN 24 08/11/2014 1425   BUN 15 09/09/2013 0522   CREATININE 1.5* 05/31/2015 1309   CREATININE 1.17* 08/11/2014 1425      Component Value Date/Time   CALCIUM 10.0 05/31/2015 1309   CALCIUM 9.7 08/11/2014 1425   ALKPHOS 98 05/31/2015 1309   ALKPHOS 129* 08/11/2014 1425   AST 30 05/31/2015 1309   AST 32 08/11/2014 1425   ALT 15 05/31/2015 1309   ALT 16 08/11/2014 1425   BILITOT 0.34 05/31/2015 1309   BILITOT 0.2 08/11/2014 1425      Results for Michele Walton (MRN [id number]) as of 06/30/2015 10:13  Ref. Range 05/31/2015 13:10  Albumin ELP Latest Ref Range: 3.8-4.8 g/dL 3.9  COMMENT (PROTEIN ELECTROPHOR) Unknown *  Alpha-1-Globulin Latest Ref Range: 0.2-0.3 g/dL 0.3  Alpha-2-Globulin Latest Ref Range: 0.5-0.9 g/dL 0.7  Beta Globulin Latest Ref Range: 0.4-0.6 g/dL 0.5  Beta 2 Latest Ref Range: 0.2-0.5 g/dL 0.4  Gamma Globulin Latest Ref Range: 0.8-1.7 g/dL 2.1 (H)  SPE Interp. Unknown *  IgG (Immunoglobin G), Serum Latest Ref Range: 339 241 3946 mg/dL 2260 (H)  IgA Latest Ref Range: 69-380 mg/dL 327  IgM, Serum Latest Ref Range: 52-322 mg/dL 70  Total Protein, Serum Electrophoresis Latest Ref Range: 6.1-8.1 g/dL 7.9    Impression and Plan:  69 year old woman with the following issues:  1. Elevated IgG level with normal IgA and IgM. Repeat the serum protein electrophoresis obtained in July 2016 did not detect a monoclonal protein. I do not think there is a evidence of any hematological disorder or plasma cell disorder. Her total IgG level was actually declining. I think this is most likely reactive in nature related to her autoimmune disease. I do not think there is any need for any further workup from a hematology standpoint.  2. Mild anemia: Likely related to multifactorial events. She has an element of anemia of chronic disease and possible adrenal insufficiency. Her anemia is mild and asymptomatic at this point.    3. Follow-up: Will be as needed in the future.  I'll be happy to see her at any time if indicated.      [facility], MD 8/4/201610:12 AM

## 2015-07-05 DIAGNOSIS — N179 Acute kidney failure, unspecified: Secondary | ICD-10-CM | POA: Diagnosis not present

## 2015-07-05 DIAGNOSIS — L932 Other local lupus erythematosus: Secondary | ICD-10-CM | POA: Diagnosis not present

## 2015-07-05 DIAGNOSIS — Z79899 Other long term (current) drug therapy: Secondary | ICD-10-CM | POA: Diagnosis not present

## 2015-07-05 DIAGNOSIS — I10 Essential (primary) hypertension: Secondary | ICD-10-CM | POA: Diagnosis not present

## 2015-07-06 DIAGNOSIS — L930 Discoid lupus erythematosus: Secondary | ICD-10-CM | POA: Diagnosis not present

## 2015-07-18 DIAGNOSIS — Z1211 Encounter for screening for malignant neoplasm of colon: Secondary | ICD-10-CM | POA: Diagnosis not present

## 2015-07-30 ENCOUNTER — Other Ambulatory Visit: Payer: Self-pay | Admitting: Adult Health

## 2015-08-01 NOTE — Telephone Encounter (Signed)
Patient has appt scheduled

## 2015-08-02 DIAGNOSIS — M545 Low back pain: Secondary | ICD-10-CM | POA: Diagnosis not present

## 2015-08-02 DIAGNOSIS — M542 Cervicalgia: Secondary | ICD-10-CM | POA: Diagnosis not present

## 2015-08-10 DIAGNOSIS — M7541 Impingement syndrome of right shoulder: Secondary | ICD-10-CM | POA: Diagnosis not present

## 2015-08-10 DIAGNOSIS — M329 Systemic lupus erythematosus, unspecified: Secondary | ICD-10-CM | POA: Diagnosis not present

## 2015-08-11 ENCOUNTER — Telehealth: Payer: Self-pay

## 2015-08-11 ENCOUNTER — Ambulatory Visit: Payer: Medicare Other | Admitting: Neurology

## 2015-08-11 NOTE — Telephone Encounter (Signed)
Called and left patient a message on her home and work phone. Patient needs to be schedule for NCV. EMG test first she missed that apt and then we can get her rescheduled with [facility] or Dr. Brett Fairy after she has NCV/EMG. We had to CX her apt today with Dr. Brett Fairy because she is out sick.

## 2015-08-16 DIAGNOSIS — M7541 Impingement syndrome of right shoulder: Secondary | ICD-10-CM | POA: Diagnosis not present

## 2015-08-16 DIAGNOSIS — M25511 Pain in right shoulder: Secondary | ICD-10-CM | POA: Diagnosis not present

## 2015-08-31 ENCOUNTER — Other Ambulatory Visit: Payer: Self-pay | Admitting: Neurology

## 2015-09-15 DIAGNOSIS — E042 Nontoxic multinodular goiter: Secondary | ICD-10-CM | POA: Diagnosis not present

## 2015-09-16 DIAGNOSIS — L03213 Periorbital cellulitis: Secondary | ICD-10-CM | POA: Diagnosis not present

## 2015-09-19 DIAGNOSIS — Z79899 Other long term (current) drug therapy: Secondary | ICD-10-CM | POA: Diagnosis not present

## 2015-09-19 DIAGNOSIS — H05011 Cellulitis of right orbit: Secondary | ICD-10-CM | POA: Diagnosis not present

## 2015-09-19 DIAGNOSIS — L932 Other local lupus erythematosus: Secondary | ICD-10-CM | POA: Diagnosis not present

## 2015-09-19 DIAGNOSIS — M19241 Secondary osteoarthritis, right hand: Secondary | ICD-10-CM | POA: Diagnosis not present

## 2015-09-20 DIAGNOSIS — B029 Zoster without complications: Secondary | ICD-10-CM | POA: Diagnosis not present

## 2015-09-22 DIAGNOSIS — E042 Nontoxic multinodular goiter: Secondary | ICD-10-CM | POA: Diagnosis not present

## 2015-09-28 ENCOUNTER — Other Ambulatory Visit: Payer: Self-pay | Admitting: Neurology

## 2015-10-10 DIAGNOSIS — I10 Essential (primary) hypertension: Secondary | ICD-10-CM | POA: Diagnosis not present

## 2015-10-10 DIAGNOSIS — Z2821 Immunization not carried out because of patient refusal: Secondary | ICD-10-CM | POA: Diagnosis not present

## 2015-10-10 DIAGNOSIS — E7800 Pure hypercholesterolemia, unspecified: Secondary | ICD-10-CM | POA: Diagnosis not present

## 2015-10-12 ENCOUNTER — Telehealth: Payer: Self-pay | Admitting: Neurology

## 2015-10-12 DIAGNOSIS — M4120 Other idiopathic scoliosis, site unspecified: Secondary | ICD-10-CM

## 2015-10-12 DIAGNOSIS — M3500 Sicca syndrome, unspecified: Secondary | ICD-10-CM

## 2015-10-12 MED ORDER — NEURONTIN 300 MG PO CAPS: 300.0000 mg | ORAL_CAPSULE | Freq: Three times a day (TID) | ORAL | Status: DC

## 2015-10-12 MED ORDER — MODAFINIL 200 MG PO TABS: 200.0000 mg | ORAL_TABLET | Freq: Every day | ORAL | Status: DC

## 2015-10-12 NOTE — Telephone Encounter (Signed)
Patient called to request refill of NEURONTIN 300 MG capsule and modafinil (PROVIGIL) 200 MG tablet

## 2015-10-13 NOTE — Telephone Encounter (Signed)
Rx has been faxed.

## 2015-10-14 NOTE — Telephone Encounter (Signed)
Phone call from [facility] [phone], advised NEURONTIN 300 MG capsule has been approved effective 10/13/15 x 1 year, letter of approval to me mailed to [place].

## 2015-10-14 NOTE — Telephone Encounter (Signed)
[employer] [phone] called to advise modafinil (PROVIGIL) 200 MG tablet has been denied for fatigue related to MS, not FDA approved for treatment under her plan, letter of denial will be mailed to patient.

## 2015-10-31 ENCOUNTER — Ambulatory Visit (INDEPENDENT_AMBULATORY_CARE_PROVIDER_SITE_OTHER): Payer: Medicare Other | Admitting: Neurology

## 2015-10-31 ENCOUNTER — Encounter: Payer: Self-pay | Admitting: Neurology

## 2015-10-31 VITALS — BP 150/68 | HR 80 | Resp 20 | Ht 62.0 in | Wt 143.0 lb

## 2015-10-31 DIAGNOSIS — M4120 Other idiopathic scoliosis, site unspecified: Secondary | ICD-10-CM

## 2015-10-31 DIAGNOSIS — R69 Illness, unspecified: Secondary | ICD-10-CM | POA: Diagnosis not present

## 2015-10-31 DIAGNOSIS — G35 Multiple sclerosis: Secondary | ICD-10-CM

## 2015-10-31 DIAGNOSIS — M3500 Sicca syndrome, unspecified: Secondary | ICD-10-CM | POA: Diagnosis not present

## 2015-10-31 MED ORDER — MODAFINIL 200 MG PO TABS: 200.0000 mg | ORAL_TABLET | Freq: Every day | ORAL | Status: DC

## 2015-10-31 NOTE — Progress Notes (Signed)
PATIENT: Michele Walton DOB: 06-22-1946  REASON FOR VISIT: follow up HISTORY FROM: patient  HISTORY OF PRESENT ILLNESS:   10-31-15  Ms. Michele Walton is a 69 year old female with a possible history of Multiple sclerosis. . She returns today for follow-up. Patient states that in the last 12 months she has had severe dry mouth and dry eyes. She states that her nose has been getting congested and feels like something is stuck way up in her head.  She states that her eyes are so dry that sometimes she can't open eyes without putting on some lubrication. She feels that the dry mouth is better manageable now than it was last year. When she was first diagnosed with MS in 1993 she had numbness and tingling in the right foot. Over time it progressed up her leg to her groin. She has never been on any disease modifying agents. She was given gabapentin, naprosyn and modafinil to help with her symptoms. She states that it progressed over the years and now she has some numbness and tingling from the waist down. She also has a history of lumbar scoliosis, kyphosis, spinal stenosis, osteoarthritis, bone spur on the left pelvis.  In 2013, she was diagnosed with cutaneous Lupus- she sees a dermatologist for this.  Since her last visit she has had some progression of her back discomfort unsure if its due to scoliosis/stenosis or MS. it is not clear to me if Michele Walton truly has MS but it is the assumption, given that she also has scoliosis and other problems that could explain several of her symptoms. Again she has not been on disease modifying drugs. She is chronically fatigued.   She has an incontinent bladder- this is not new. She is having some difficulty walking but thinks it's due to her anatomy.  She fell in 2014, she hit her head on the file cabinet. No falls since     HISTORY 08/04/13 (CD): Michele Walton has been followed for multiple sclerosis in this office. She had been originally evaluated in November  1994 when she presented for sensory abnormalities That were felt to relate to the spinal cord level at the thoracic eighth vertebrae.  She presented at that time with hyperreflexia on the left. An MRI of the spinal cord showed what was thought to be demyelinating Plaques.  She had normal MRIs of the brain in followup, normal Evoked visual potentials, and initially CSF was negative for oligoclonal bands. There has always been a question of another autoimmune disease being involved since the patient tested positive for antinuclear antibodies. She was referred to rheumatology and started on Plaquenil. She followed also at [facility] from 2000 on this Michele Walton will prescribed Betaseron. Later that year, she returned to [facility] and followed up with Michele Walton., who imaged the patient twice and found that they were thoracic and upper cervical spinal cord signals ( in the cervical C 2-5 and upper thoracic cord Th 2 ) he also repeated the CSF tested and found now two oligoclonal bands.  The patient never experienced clinical relapsing remitting disorder , had never an optic neuritis and thus a question of a primary progressive MS disease was also raised- because followup MRI of the brain the last in 2013 failed to document demyelinating lesions again . An antibody for neuromyelitis optica ( NMO - Devic's) was not found.  The patient has quite significant scoliosis of the spine and has presented with a 37 curving, she has no pain  associated with the back right now .  Her spinal degenerative disease and other joint diseases can be attributed to her posture and abnormal shape of the spinal column.  She continues to exercise and does water exercises.  She has noticed lower extremity progressive weakness. 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 As I am less sure of dealing with multiple sclerosis in this patient,  I had ordered an immune disease panel which  included an ANA., and which again returned positive.  A total creatinine kinase was a normal range of 132 units per liter, but her CK-MB was elevated at 6.2.  Ds DNA test was negative , and Sjogren's DNA test returned positive for SSA Ro and SSB -L A. Antibody at a more than 8 times the normal titer. Double-stranded DNA was negative.  I would like Michele Walton and Michele Walton to evaluate, if this patient may have another autoimmune disorder syndrome or a systemic form of Lupus and not just the cutaneus form of lupus.  She shows no signs of scleroderma , but I have not tested her for polymyositis or dermatomyositis.  I would like to add a steroid taper to Actonel to see if the patient's myalgia or and muscle atrophy would respond. I have also discussed with the patient when the best time for a trial like this would be. Since she is preparing to have hip surgery, I would like to give her at least a two-week prednisone treatment prior. I hope that this will limit the stiffness , and helps her to recover quicker also couldn't tolerate physical therapy and occupational therapy after surgery.      REVIEW OF SYSTEMS: Full 14 system review of systems performed and notable only for:  Constitutional: Fatigue Eyes: Light sensitivity, loss of vision, blurred vision Ear/Nose/Throat: Trouble swallowing  Skin: Itching = the patient was diagnosed with Sjogren's syndrome and has a very dry mouth reports changes in taste over the years. Gastrointestinal: Constipation Genitourinary: Incontinence of bladder, frequency of urination, urgency- Michele Walton  Hematology/Lymphatic: Bruise/bleed easily,  Cold intolerance Musculoskeletal: Joint pain, joint swelling, back pain, aching muscles, walking difficulty, neck pain - scoliosis. Gait disorder.  Neurological: Numbness, weakness Sleep: Insomnia, frequent waking   ALLERGIES: Allergies  Allergen Reactions  . Latex Rash    Peels her skin  . Percocet  [Oxycodone-Acetaminophen] Nausea Only and Rash  . Sulfa Antibiotics Nausea Only and Rash  . Macrobid [Nitrofurantoin] Nausea Only  . Tape Rash    Tears skin    HOME MEDICATIONS: Outpatient Prescriptions Prior to Visit  Medication Sig Dispense Refill  . aspirin EC 81 MG tablet Take 81 mg by mouth daily.    . benazepril-hydrochlorthiazide (LOTENSIN HCT) 20-25 MG per tablet Take 1 tablet by mouth every morning.    . calcium carbonate 200 MG capsule Take 200 mg by mouth daily.     . cholecalciferol (VITAMIN D) 1000 UNITS tablet Take 1,000 Units by mouth daily.    . Cyanocobalamin (VITAMIN B 12 PO) Take 1,000 mcg by mouth daily.     . fesoterodine (TOVIAZ) 4 MG TB24 Take 4 mg by mouth daily.    . hydroxychloroquine (PLAQUENIL) 200 MG tablet Take by mouth.    . modafinil (PROVIGIL) 200 MG tablet Take 1 tablet (200 mg total) by mouth daily. 30 tablet 0  . Multiple Vitamin (MULTI-VITAMINS) TABS Take by mouth.    . NEURONTIN 300 MG capsule Take 1 capsule (300 mg total) by mouth  3 (three) times daily. 90 capsule 0  . pilocarpine (SALAGEN) 5 MG tablet Take 5 mg by mouth 3 (three) times daily.  0  . polyethylene glycol (MIRALAX / GLYCOLAX) packet Take 17 g by mouth daily as needed. 14 each 0  . vitamin E 400 UNIT capsule Take 400 Units by mouth daily.     No facility-administered medications prior to visit.    PAST MEDICAL HISTORY: Past Medical History  Diagnosis Date  . Multiple sclerosis ([place])   . Degenerative joint disease   . Bursitis   . Osteoarthritis   . AC (acromioclavicular) joint bone spurs   . Lupus ([facility])     hx. subcutaneous lupus.  . Goiter, nontoxic, multinodular   . Skin cancer   . Chronic pain   . High cholesterol   . Hypertension   . Scoliosis   . Arthritis   . Torn meniscus 1/14    left knee  . 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   Marland Kitchen PONV  (postoperative nausea and vomiting)   . Sjogren's disease ([place]) 09/2014    PAST SURGICAL HISTORY: Past Surgical History  Procedure Laterality Date  . Knee surgery Left 09-01-13    1'14  . Tonsillectomy    . Childbirth  [id number]    admissions  . Dilation and curettage of uterus    . Skin cancer excision      right nasal bridge  . Total hip arthroplasty Left 09/08/2013    Procedure: LEFT TOTAL HIP ARTHROPLASTY ANTERIOR APPROACH;  Surgeon: Mauri Pole, MD;  Location: [facility];  Service: Orthopedics;  Laterality: Left;    FAMILY HISTORY: Family History  Problem Relation Age of Onset  . Heart disease Mother   . Cancer Other     SOCIAL HISTORY: Social History   Social History  . Marital Status: Single    Spouse Name: N/A  . Number of Children: 2  . Years of Education: Bachelor   Occupational History  . plan administer     [employer]   Social History Main Topics  . Smoking status: Never Smoker   . Smokeless tobacco: Never Used  . Alcohol Use: No  . Drug Use: No  . Sexual Activity: Yes   Other Topics Concern  . Not on file   Social History Narrative   Patient is single with 2 children.   Patient is right handed   Patient has a Bachelor's degree.   Patient drinks 1 cup daily.      PHYSICAL EXAM  Filed Vitals:   10/31/15 0942  BP: 150/68  Pulse: 80  Resp: 20  Height: 5\' 2"  (1.575 m)  Weight: 143 lb (64.864 kg)   Body mass index is 26.15 kg/(m^2).  Generalized: Well developed, in no acute distress   Neurological examination  Mentation: Alert oriented to time, place, history taking. Follows all commands speech and language fluent Cranial nerve : Decreased ability to taste, attributed to Sjogren's syndrome. The ability to smell is the same.  Dry Eyes . Pupils were equal round reactive to light. Extraocular movements were full, visual field were full on confrontational test. Facial sensation and strength were normal.  Uvula and tongue midline. Head  turning and shoulder shrug  were normal and symmetric. Motor: Full mobility and range of motion for both shoulders elbow flexion and extension, wrist flexion and extension, bilateral good grip strength.  Sensory: Decreased sensory to fine touch and vibration from the groin downwards. Always more right dominant than left leg dominant Coordination: Cerebellar testing reveals good finger-nose-finger . Gait and station: Slightly stooped posture. Mrs. Foco uses a cane for longer distances and depending on how she feels.  Shoulders are uneven- right shoulder higher than left. Tandem gait is slightly unsteady Romberg is negative. No drift is seen.  Reflexes: Deep tendon reflexes are symmetric bilaterally. Downgoing Babinski.   DIAGNOSTIC DATA (LABS, IMAGING, TESTING) - I reviewed patient records, labs, notes, testing and imaging myself where available.  Lab Results  Component Value Date   WBC 4.6 05/31/2015   HGB 10.8* 05/31/2015   HCT 32.0* 05/31/2015   MCV 91.7 05/31/2015   PLT 221 05/31/2015      Component Value Date/Time   NA 137 05/31/2015 1309   NA 139 08/11/2014 1425   NA 135 09/09/2013 0522   K 3.9 05/31/2015 1309   K 3.8 08/11/2014 1425   CL 98 08/11/2014 1425   CO2 29 05/31/2015 1309   CO2 28 08/11/2014 1425   GLUCOSE 85 05/31/2015 1309   GLUCOSE 81 08/11/2014 1425   GLUCOSE 110* 09/09/2013 0522   BUN 26.5* 05/31/2015 1309   BUN 24 08/11/2014 1425   BUN 15 09/09/2013 0522   CREATININE 1.5* 05/31/2015 1309   CREATININE 1.17* 08/11/2014 1425   CALCIUM 10.0 05/31/2015 1309   CALCIUM 9.7 08/11/2014 1425   PROT 7.9 05/31/2015 1309   PROT 8.4 08/11/2014 1425   ALBUMIN 3.6 05/31/2015 1309   ALBUMIN 4.0 08/11/2014 1425   AST 30 05/31/2015 1309   AST 32 08/11/2014 1425   ALT 15 05/31/2015 1309   ALT 16 08/11/2014 1425   ALKPHOS 98 05/31/2015 1309   ALKPHOS 129* 08/11/2014 1425   BILITOT 0.34 05/31/2015 1309   BILITOT 0.2 08/11/2014 1425   GFRNONAA 48* 08/11/2014 1425     GFRAA 55* 08/11/2014 1425       ASSESSMENT AND PLAN  ;  30 minute visit, with more than 50% of our face-to-face time dedicated to the discussion of the diagnosis, the symptoms and there are to be fusion to the different underlying diagnoses and the coordination of care.   1. Presumed Multiple sclerosis by MRI, but negative for oligoclonal bands and clinically not relapsing remitting. Negative for DEVICS by NMO lab test. The patient may have undergone at one time an acute demyelinating injury,  explaining her MRI findings. If this is multiple sclerosis and is most likely a primary progressive form.. 2. Cutaneous changes, Dry eyes, dry mouth- Sjoergrens syndrome. This may have been the cause of her white mater changes. Dr. Estanislado Pandy prescribed pilocarpine as a tablet, 3 a day po. I would like her to intiate this medication. The patient is concerned because she read about the visual impact and side effects, this medication is often used as an eyedrop as well I think that from an oral medication I would be less concerned that she would suffer loss of visual acuity.  She is also concerned that she forgets her lunchtime dose.  She may want to start just this twice a day to look up in tablets and then have a revisit with Dr. Estanislado Pandy - She does not feel that bothered by her dry mouth right now, but for her dental health and would be very important to have normal salivation.  3) scoliosis with progressing back and shoulder problems. Dr. Trenton Gammon.    It doesn't appear that the patient  has had any progression of her central nervous system findings;  by MRI all were remote, non acute.    She had been diagnosed  In 2014 finally with Sjoegren's, which does explain the  symptoms of dry eyes and dry mouth . She states that she feels that something is stuck in her nose.  She does report some chronic fatigue which could be due to the auto immune process. She continues to have the numbness and tingling in the  lower extremities up to the waist.     The patient has had repeat a MRI of her brain, cervical, thoracic spine last 2015.  Her MRI was dated 09-06-14 and interpreted by my colleague Dr. Leta Baptist .  It showed mild scattered periatrial subcortical T2 hyperintensities. Periventricular some of these are oriented perpendicular to the lateral ventricles. The consideration includes chronic multiple sclerosis and other autoimmune or inflammatory processes. The patient does not have signs of microvascular ischemic etiologies given her history.  She does not suffer from any vasculitis and she has not had blood clotting disorders DVTs pulmonary emboli etc. I will repeat today that there was no MRI documentation of  progression of her white matter disease, which may not be MS !   The patient's cervical spine MRI from the same date 09-07-14 showed severe by foraminal stenosis at C6 and C7 and moderate stenosis above and below. There were chronic demyelinating plaques noted at C3 and 4 C7 and T2 these again speak more for MS. Her thoracic spine also showed some chronic demyelinating plaques but no spinal stenosis or foraminal stenosis. She does have some difficulty with gait however I think that has do to her scoliosis and kyphosis    The patient should followup in 6 months with NP after next MRI, refilled modafinil     Bailyn Spackman, MD   10/31/2015, 10:00 AM [facility] [address] [place], [place] [address] [phone]  Cc Britt Bottom, MD   and Bo Merino , MD

## 2015-11-08 ENCOUNTER — Other Ambulatory Visit: Payer: Self-pay | Admitting: Neurology

## 2015-11-11 ENCOUNTER — Telehealth: Payer: Self-pay | Admitting: Neurology

## 2015-11-11 DIAGNOSIS — N3281 Overactive bladder: Secondary | ICD-10-CM | POA: Diagnosis not present

## 2015-11-11 DIAGNOSIS — R3915 Urgency of urination: Secondary | ICD-10-CM | POA: Diagnosis not present

## 2015-11-11 NOTE — Telephone Encounter (Signed)
Elmyra Ricks with [facility] [phone] called - this medication has been denied- non FDA compliance use. Letter will sent.

## 2015-11-11 NOTE — Telephone Encounter (Signed)
Ins has already been contacted.  I called the pharmacy to advise.  They are aware.

## 2015-11-11 NOTE — Telephone Encounter (Signed)
Pt need prior auth on modafinil (PROVIGIL) 200 MG tablet, [facility] called to request it.

## 2015-11-23 NOTE — Telephone Encounter (Addendum)
Michele Walton [phone] with [facility] called needs primary dx for appeal. This is an expedited call, she would like return call by 12:00 on 11/24/15.

## 2015-11-23 NOTE — Telephone Encounter (Signed)
I called back.  Got no answer.  Left message.  

## 2015-12-06 NOTE — Telephone Encounter (Signed)
Phone call from [employer] [phone] called to advise appeal overturned, patient had already appealed and medication modafinil (PROVIGIL) 200 MG tablet  is now covered.

## 2015-12-15 DIAGNOSIS — Z1231 Encounter for screening mammogram for malignant neoplasm of breast: Secondary | ICD-10-CM | POA: Diagnosis not present

## 2015-12-15 DIAGNOSIS — N905 Atrophy of vulva: Secondary | ICD-10-CM | POA: Diagnosis not present

## 2015-12-21 DIAGNOSIS — I10 Essential (primary) hypertension: Secondary | ICD-10-CM | POA: Diagnosis not present

## 2015-12-21 DIAGNOSIS — E7800 Pure hypercholesterolemia, unspecified: Secondary | ICD-10-CM | POA: Diagnosis not present

## 2016-01-11 DIAGNOSIS — Z79899 Other long term (current) drug therapy: Secondary | ICD-10-CM | POA: Diagnosis not present

## 2016-01-11 DIAGNOSIS — L932 Other local lupus erythematosus: Secondary | ICD-10-CM | POA: Diagnosis not present

## 2016-01-11 DIAGNOSIS — I10 Essential (primary) hypertension: Secondary | ICD-10-CM | POA: Diagnosis not present

## 2016-01-11 DIAGNOSIS — N179 Acute kidney failure, unspecified: Secondary | ICD-10-CM | POA: Diagnosis not present

## 2016-01-25 DIAGNOSIS — M329 Systemic lupus erythematosus, unspecified: Secondary | ICD-10-CM | POA: Diagnosis not present

## 2016-01-30 DIAGNOSIS — M19241 Secondary osteoarthritis, right hand: Secondary | ICD-10-CM | POA: Diagnosis not present

## 2016-01-30 DIAGNOSIS — Z79899 Other long term (current) drug therapy: Secondary | ICD-10-CM | POA: Diagnosis not present

## 2016-01-30 DIAGNOSIS — L932 Other local lupus erythematosus: Secondary | ICD-10-CM | POA: Diagnosis not present

## 2016-01-30 DIAGNOSIS — M170 Bilateral primary osteoarthritis of knee: Secondary | ICD-10-CM | POA: Diagnosis not present

## 2016-02-16 DIAGNOSIS — M3210 Systemic lupus erythematosus, organ or system involvement unspecified: Secondary | ICD-10-CM | POA: Diagnosis not present

## 2016-02-16 DIAGNOSIS — Z79899 Other long term (current) drug therapy: Secondary | ICD-10-CM | POA: Diagnosis not present

## 2016-04-19 ENCOUNTER — Other Ambulatory Visit: Payer: Self-pay | Admitting: Neurology

## 2016-04-30 ENCOUNTER — Encounter: Payer: Self-pay | Admitting: Adult Health

## 2016-04-30 ENCOUNTER — Ambulatory Visit (INDEPENDENT_AMBULATORY_CARE_PROVIDER_SITE_OTHER): Payer: Medicare Other | Admitting: Adult Health

## 2016-04-30 VITALS — BP 110/66 | HR 76 | Resp 14 | Ht 62.0 in | Wt 134.0 lb

## 2016-04-30 DIAGNOSIS — G4719 Other hypersomnia: Secondary | ICD-10-CM | POA: Diagnosis not present

## 2016-04-30 DIAGNOSIS — M3500 Sicca syndrome, unspecified: Secondary | ICD-10-CM | POA: Diagnosis not present

## 2016-04-30 DIAGNOSIS — M5430 Sciatica, unspecified side: Secondary | ICD-10-CM | POA: Diagnosis not present

## 2016-04-30 DIAGNOSIS — M4120 Other idiopathic scoliosis, site unspecified: Secondary | ICD-10-CM | POA: Diagnosis not present

## 2016-04-30 MED ORDER — MODAFINIL 200 MG PO TABS: 200.0000 mg | ORAL_TABLET | Freq: Every day | ORAL | Status: DC

## 2016-04-30 MED ORDER — NEURONTIN 300 MG PO CAPS: ORAL_CAPSULE | ORAL | Status: DC

## 2016-04-30 NOTE — Progress Notes (Signed)
I agree with the assessment and plan as directed by NP .The patient is known to me .   Joene Gelder, MD  

## 2016-04-30 NOTE — Patient Instructions (Signed)
Continue Provigil and Neurontin  If your symptoms worsen or you develop new symptoms please let us know.

## 2016-04-30 NOTE — Progress Notes (Signed)
PATIENT: Michele Walton DOB: 1946-10-01  REASON FOR VISIT: follow up- sjoegren. , scoliosis HISTORY FROM: patient  HISTORY OF PRESENT ILLNESS: Michele Walton Is a 70 year old female with a history of sjoegren disease's and scoliosis. She returns today for follow-up. Overall the patient states that she is doing well. She denies any new neurological symptoms. She states that she is currently not on any medication for her sjoegren's she states that most medications that was tried caused issues with her bladder. She was put on Biotene and feels like this works well for her dry mouth. She continues to use Provigil for excessive daytime sleepiness. She states that this also works well. The patient is also on gabapentin for sciatic pain. She states that she has been on this for years. She states that her balance has improved. She denies any falls. She has noticed that her gait has improved with going up and down steps. She returns today for an evaluation.  HISTORY 10/31/15: Michele Walton is a 70 year old female with a possible history of Multiple sclerosis. . She returns today for follow-up. Patient states that in the last 12 months she has had severe dry mouth and dry eyes. She states that her nose has been getting congested and feels like something is stuck way up in her head.  She states that her eyes are so dry that sometimes she can't open eyes without putting on some lubrication. She feels that the dry mouth is better manageable now than it was last year. When she was first diagnosed with MS in 1993 she had numbness and tingling in the right foot. Over time it progressed up her leg to her groin. She has never been on any disease modifying agents. She was given gabapentin, naprosyn and modafinil to help with her symptoms. She states that it progressed over the years and now she has some numbness and tingling from the waist down. She also has a history of lumbar scoliosis, kyphosis, spinal stenosis,  osteoarthritis, bone spur on the left pelvis. In 2013, she was diagnosed with cutaneous Lupus- she sees a dermatologist for this.  Since her last visit she has had some progression of her back discomfort unsure if its due to scoliosis/stenosis or MS. it is not clear to me if Michele Walton truly has MS but it is the assumption, given that she also has scoliosis and other problems that could explain several of her symptoms. Again she has not been on disease modifying drugs. She is chronically fatigued.  She has an incontinent bladder- this is not new. She is having some difficulty walking but thinks it's due to her anatomy.  She fell in 2014, she hit her head on the file cabinet. No falls since     HISTORY 08/04/13 (CD): Michele Walton has been followed for multiple sclerosis in this office. She had been originally evaluated in November 1994 when she presented for sensory abnormalities That were felt to relate to the spinal cord level at the thoracic eighth vertebrae.  She presented at that time with hyperreflexia on the left. An MRI of the spinal cord showed what was thought to be demyelinating Plaques.  She had normal MRIs of the brain in followup, normal Evoked visual potentials, and initially CSF was negative for oligoclonal bands. There has always been a question of another autoimmune disease being involved since the patient tested positive for antinuclear antibodies. She was referred to rheumatology and started on Plaquenil. She followed also at [facility] from  2000 on this Dr. Cyndra Numbers will prescribed Betaseron. Later that year, she returned to [facility] and followed up with Dr. Erling Cruz., who imaged the patient twice and found that they were thoracic and upper cervical spinal cord signals ( in the cervical C 2-5 and upper thoracic cord Th 2 ) he also repeated the CSF tested and found now two oligoclonal bands.  The patient never experienced clinical relapsing remitting disorder , had never an  optic neuritis and thus a question of a primary progressive MS disease was also raised- because followup MRI of the brain the last in 2013 failed to document demyelinating lesions again . An antibody for neuromyelitis optica ( NMO - Devic's) was not found.  The patient has quite significant scoliosis of the spine and has presented with a 37 curving, she has no pain associated with the back right now .  Her spinal degenerative disease and other joint diseases can be attributed to her posture and abnormal shape of the spinal column.  She continues to exercise and does water exercises.  She has noticed lower extremity progressive weakness. 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 As I am less sure of dealing with multiple sclerosis in this patient,  I had ordered an immune disease panel which included an ANA., and which again returned positive.  A total creatinine kinase was a normal range of 132 units per liter, but her CK-MB was elevated at 6.2.  Ds DNA test was negative , and Sjogren's DNA test returned positive for SSA Ro and SSB -L A. Antibody at a more than 8 times the normal titer. Double-stranded DNA was negative.  I would like Dr. Amil Amen and Dr. Inda Merlin to evaluate, if this patient may have another autoimmune disorder syndrome or a systemic form of Lupus and not just the cutaneus form of lupus.  She shows no signs of scleroderma , but I have not tested her for polymyositis or dermatomyositis.  I would like to add a steroid taper to Actonel to see if the patient's myalgia or and muscle atrophy would respond. I have also discussed with the patient when the best time for a trial like this would be. Since she is preparing to have hip surgery, I would like to give her at least a two-week prednisone treatment prior. I hope that this will limit the stiffness , and helps her to recover quicker also couldn't tolerate physical therapy and occupational  therapy after surgery.       REVIEW OF SYSTEMS: Out of a complete 14 system review of symptoms, the patient complains only of the following symptoms, and all other reviewed systems are negative.  See history of present illness  ALLERGIES: Allergies  Allergen Reactions  . Latex Rash    Peels her skin  . Percocet [Oxycodone-Acetaminophen] Nausea Only and Rash  . Sulfa Antibiotics Nausea Only and Rash  . Macrobid [Nitrofurantoin] Nausea Only  . Tape Rash    Tears skin    HOME MEDICATIONS: Outpatient Prescriptions Prior to Visit  Medication Sig Dispense Refill  . aspirin EC 81 MG tablet Take 81 mg by mouth daily.    . benazepril-hydrochlorthiazide (LOTENSIN HCT) 20-25 MG per tablet Take 1 tablet by mouth every morning.    . calcium carbonate 200 MG capsule Take 200 mg by mouth daily.     . cholecalciferol (VITAMIN D) 1000 UNITS tablet Take 1,000 Units by mouth daily.    . Cyanocobalamin (VITAMIN B 12 PO) Take 1,000  mcg by mouth daily.     . fesoterodine (TOVIAZ) 4 MG TB24 Take 4 mg by mouth daily.    . hydroxychloroquine (PLAQUENIL) 200 MG tablet Take by mouth.    . modafinil (PROVIGIL) 200 MG tablet Take 1 tablet (200 mg total) by mouth daily. 30 tablet 5  . Multiple Vitamin (MULTI-VITAMINS) TABS Take by mouth.    . NEURONTIN 300 MG capsule TAKE 1 CAPSULE(300 MG) BY MOUTH THREE TIMES DAILY 90 capsule 0  . vitamin E 400 UNIT capsule Take 400 Units by mouth daily.    . pilocarpine (SALAGEN) 5 MG tablet Take 5 mg by mouth 3 (three) times daily. Reported on 04/30/2016  0  . polyethylene glycol (MIRALAX / GLYCOLAX) packet Take 17 g by mouth daily as needed. (Patient not taking: Reported on 04/30/2016) 14 each 0   No facility-administered medications prior to visit.    PAST MEDICAL HISTORY: Past Medical History  Diagnosis Date  . Multiple sclerosis ([place])   . Degenerative joint disease   . Bursitis   . Osteoarthritis   . AC (acromioclavicular) joint bone spurs   . Lupus ([facility])      hx. subcutaneous lupus.  . Goiter, nontoxic, multinodular   . Skin cancer   . Chronic pain   . High cholesterol   . Hypertension   . Scoliosis   . Arthritis   . Torn meniscus 1/14    left knee  . 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   Marland Kitchen PONV (postoperative nausea and vomiting)   . Sjogren's disease ([place]) 09/2014    PAST SURGICAL HISTORY: Past Surgical History  Procedure Laterality Date  . Knee surgery Left 09-01-13    1'14  . Tonsillectomy    . Childbirth  [id number]    admissions  . Dilation and curettage of uterus    . Skin cancer excision      right nasal bridge  . Total hip arthroplasty Left 09/08/2013    Procedure: LEFT TOTAL HIP ARTHROPLASTY ANTERIOR APPROACH;  Surgeon: Mauri Pole, MD;  Location: [facility];  Service: Orthopedics;  Laterality: Left;    FAMILY HISTORY: Family History  Problem Relation Age of Onset  . Heart disease Mother   . Cancer Other     SOCIAL HISTORY: Social History   Social History  . Marital Status: Single    Spouse Name: N/A  . Number of Children: 2  . Years of Education: Bachelor   Occupational History  . plan administer     [employer]   Social History Main Topics  . Smoking status: Never Smoker   . Smokeless tobacco: Never Used  . Alcohol Use: No  . Drug Use: No  . Sexual Activity: Yes   Other Topics Concern  . Not on file   Social History Narrative   Patient is single with 2 children.   Patient is right handed   Patient has a Bachelor's degree.   Patient drinks 1 cup daily.      PHYSICAL EXAM  Filed Vitals:   04/30/16 1259  BP: 110/66  Pulse: 76  Resp: 14  Height: 5\' 2"  (1.575 m)  Weight: 134 lb (60.782 kg)   Body mass index is 24.5 kg/(m^2).  Generalized: Well developed, in no acute distress   Neurological examination  Mentation: Alert oriented to time, place, history taking.  Follows all commands speech and language  fluent Cranial nerve II-XII: Pupils were equal round reactive to light. Extraocular movements were full, visual field were full on confrontational test. Facial sensation and strength were normal. Uvula tongue midline. Head turning and shoulder shrug  were normal and symmetric. Motor: The motor testing reveals 5 over 5 strength of all 4 extremities. Good symmetric motor tone is noted throughout.  Sensory: Sensory testing is intact to soft touch on all 4 extremities. No evidence of extinction is noted.  Coordination: Cerebellar testing reveals good finger-nose-finger and heel-to-shin bilaterally.  Gait and station: stooped posture due to scoliosis. Gait is normal. Tandem gait is slightly unsteady. Romberg is negative. No drift is seen.  Reflexes: Deep tendon reflexes are symmetric and normal bilaterally.   DIAGNOSTIC DATA (LABS, IMAGING, TESTING) - I reviewed patient records, labs, notes, testing and imaging myself where available.  Lab Results  Component Value Date   WBC 4.6 05/31/2015   HGB 10.8* 05/31/2015   HCT 32.0* 05/31/2015   MCV 91.7 05/31/2015   PLT 221 05/31/2015      ASSESSMENT AND PLAN 70 y.o. year old female  has a past medical history of Multiple sclerosis ([place]); Degenerative joint disease; Bursitis; Osteoarthritis; AC (acromioclavicular) joint bone spurs; Lupus ([place]); Goiter, nontoxic, multinodular; Skin cancer; Chronic pain; High cholesterol; Hypertension; Scoliosis; Arthritis; Torn meniscus (1/14); Autoimmune disease, not elsewhere classified(279.49) (08/04/2013); PONV (postoperative nausea and vomiting); and Sjogren's disease ([place]) (09/2014). here with:  1. Sjogren's disease   2. Scoliosis 3. Daytime sleepiness 4. Sciatic pain  Overall the patient has done well. She is currently using Biotene products to treat her symptoms from sjogren's. She will continue using Provigil for daytime sleepiness. I sent in for refill today. She will  continue using gabapentin for sciatic related pain. Patient advised that if her symptoms worsen or she develops any new symptoms she she'll let us know. She will follow-up in 6 months or sooner if needed     Ward Givens, MSN, NP-C 04/30/2016, 1:04 PM [facility] [address] [place], [place] [address] [phone]

## 2016-05-25 DIAGNOSIS — N179 Acute kidney failure, unspecified: Secondary | ICD-10-CM | POA: Diagnosis not present

## 2016-06-06 DIAGNOSIS — H52223 Regular astigmatism, bilateral: Secondary | ICD-10-CM | POA: Diagnosis not present

## 2016-06-06 DIAGNOSIS — H524 Presbyopia: Secondary | ICD-10-CM | POA: Diagnosis not present

## 2016-06-06 DIAGNOSIS — H5213 Myopia, bilateral: Secondary | ICD-10-CM | POA: Diagnosis not present

## 2016-06-06 DIAGNOSIS — G35 Multiple sclerosis: Secondary | ICD-10-CM | POA: Diagnosis not present

## 2016-07-10 DIAGNOSIS — Z09 Encounter for follow-up examination after completed treatment for conditions other than malignant neoplasm: Secondary | ICD-10-CM | POA: Diagnosis not present

## 2016-07-10 DIAGNOSIS — M3509 Sicca syndrome with other organ involvement: Secondary | ICD-10-CM | POA: Diagnosis not present

## 2016-07-10 DIAGNOSIS — M25561 Pain in right knee: Secondary | ICD-10-CM | POA: Diagnosis not present

## 2016-07-10 DIAGNOSIS — M170 Bilateral primary osteoarthritis of knee: Secondary | ICD-10-CM | POA: Diagnosis not present

## 2016-07-10 DIAGNOSIS — Z79899 Other long term (current) drug therapy: Secondary | ICD-10-CM | POA: Diagnosis not present

## 2016-07-10 DIAGNOSIS — R300 Dysuria: Secondary | ICD-10-CM | POA: Diagnosis not present

## 2016-10-30 ENCOUNTER — Encounter: Payer: Self-pay | Admitting: Adult Health

## 2016-10-30 ENCOUNTER — Ambulatory Visit (INDEPENDENT_AMBULATORY_CARE_PROVIDER_SITE_OTHER): Payer: Medicare Other | Admitting: Adult Health

## 2016-10-30 VITALS — BP 112/66 | HR 77 | Ht 62.0 in | Wt 133.6 lb

## 2016-10-30 DIAGNOSIS — M4120 Other idiopathic scoliosis, site unspecified: Secondary | ICD-10-CM

## 2016-10-30 DIAGNOSIS — M3500 Sicca syndrome, unspecified: Secondary | ICD-10-CM

## 2016-10-30 DIAGNOSIS — M5430 Sciatica, unspecified side: Secondary | ICD-10-CM | POA: Diagnosis not present

## 2016-10-30 DIAGNOSIS — R400 Somnolence: Secondary | ICD-10-CM

## 2016-10-30 MED ORDER — MODAFINIL 200 MG PO TABS: 200.0000 mg | ORAL_TABLET | Freq: Every day | ORAL | 5 refills | Status: DC

## 2016-10-30 NOTE — Progress Notes (Signed)
PATIENT: Michele Walton DOB: 1946/01/29  REASON FOR VISIT: follow up- sjoegren's disease, scoliosis, sciatic pain multiple sclerosis  HISTORY FROM: patient  HISTORY OF PRESENT ILLNESS: Michele Walton is a 70 year old female with a history of Sjoegren's disease  disease, scoliosis, sciatic pain. She returns today for follow-up. She states overall she is doing well. She continues to use gabapentin for her sciatic pain and feels that this works well for her. She does state that she has some muscle pain related to scoliosis. She is able to complete all ADLs and apparently. She uses Provigil for excessive daytime sleepiness. Reports that this is working well for her. In the past the patient has been sitter for multiple sclerosis as her MRI of the spine shows demyelinating plaques however she has been negative for all oligoclonal bands and clinically has not presented with relapsing symptoms or progressive symptoms. She returns today for follow-up.   HISTORY 04/30/16: Michele Walton Is a 70 year old female with a history of sjoegren disease's and scoliosis. She returns today for follow-up. Overall the patient states that she is doing well. She denies any new neurological symptoms. She states that she is currently not on any medication for her sjoegren's she states that most medications that was tried caused issues with her bladder. She was put on Biotene and feels like this works well for her dry mouth. She continues to use Provigil for excessive daytime sleepiness. She states that this also works well. The patient is also on gabapentin for sciatic pain. She states that she has been on this for years. She states that her balance has improved. She denies any falls. She has noticed that her gait has improved with going up and down steps. She returns today for an evaluation.  HISTORY 10/31/15: Michele Walton is a 70 year old female with a possible history of Multiple sclerosis. . She returns today for follow-up.  Patient states that in the last 12 months she has had severe dry mouth and dry eyes. She states that her nose has been getting congested and feels like something is stuck way up in her head.  She states that her eyes are so dry that sometimes she can't open eyes without putting on some lubrication. She feels that the dry mouth is better manageable now than it was last year. When she was first diagnosed with MS in 1993 she had numbness and tingling in the right foot. Over time it progressed up her leg to her groin. She has never been on any disease modifying agents. She was given gabapentin, naprosyn and modafinil to help with her symptoms. She states that it progressed over the years and now she has some numbness and tingling from the waist down. She also has a history of lumbar scoliosis, kyphosis, spinal stenosis, osteoarthritis, bone spur on the left pelvis. In 2013, she was diagnosed with cutaneous Lupus- she sees a dermatologist for this.  Since her last visit she has had some progression of her back discomfort unsure if its due to scoliosis/stenosis or MS. it is not clear to me if Michele Walton truly has MS but it is the assumption, given that she also has scoliosis and other problems that could explain several of her symptoms. Again she has not been on disease modifying drugs. She is chronically fatigued.  She has an incontinent bladder- this is not new. She is having some difficulty walking but thinks it's due to her anatomy.  She fell in 2014, she hit her head on  the file cabinet. No falls since     REVIEW OF SYSTEMS: Out of a complete 14 system review of symptoms, the patient complains only of the following symptoms, and all other reviewed systems are negative.  Light sensitivity, blurred vision, incontinence of bladder, joint pain, aching muscles  ALLERGIES: Allergies  Allergen Reactions  . Latex Rash    Peels her skin  . Percocet [Oxycodone-Acetaminophen] Nausea Only and Rash    . Sulfa Antibiotics Nausea Only and Rash  . Macrobid [Nitrofurantoin] Nausea Only  . Tape Rash    Tears skin    HOME MEDICATIONS: Outpatient Medications Prior to Visit  Medication Sig Dispense Refill  . aspirin EC 81 MG tablet Take 81 mg by mouth daily.    . benazepril-hydrochlorthiazide (LOTENSIN HCT) 20-25 MG per tablet Take 1 tablet by mouth every morning.    . calcium carbonate 200 MG capsule Take 200 mg by mouth daily.     . cholecalciferol (VITAMIN D) 1000 UNITS tablet Take 1,000 Units by mouth daily.    . Cyanocobalamin (VITAMIN B 12 PO) Take 1,000 mcg by mouth daily.     . fesoterodine (TOVIAZ) 4 MG TB24 Take 4 mg by mouth daily.    . hydroxychloroquine (PLAQUENIL) 200 MG tablet Take by mouth.    . modafinil (PROVIGIL) 200 MG tablet Take 1 tablet (200 mg total) by mouth daily. 30 tablet 5  . Multiple Vitamin (MULTI-VITAMINS) TABS Take by mouth.    . NEURONTIN 300 MG capsule TAKE 1 CAPSULE(300 MG) BY MOUTH THREE TIMES DAILY 90 capsule 11  . polyethylene glycol (MIRALAX / GLYCOLAX) packet Take 17 g by mouth daily as needed. 14 each 0  . vitamin E 400 UNIT capsule Take 400 Units by mouth daily.    . pilocarpine (SALAGEN) 5 MG tablet Take 5 mg by mouth 3 (three) times daily. Reported on 04/30/2016  0   No facility-administered medications prior to visit.     PAST MEDICAL HISTORY: Past Medical History:  Diagnosis Date  . AC (acromioclavicular) joint bone spurs   . Arthritis   . 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   Marland Kitchen Bursitis   . Chronic pain   . Degenerative joint disease   . Goiter, nontoxic, multinodular   . High cholesterol   . Hypertension   . Lupus    hx. subcutaneous lupus.  . Multiple sclerosis ([place])   . Osteoarthritis   . PONV (postoperative nausea and vomiting)   . Scoliosis   . Sjogren's disease ([place]) 09/2014  . Skin cancer   . Torn meniscus  1/14   left knee    PAST SURGICAL HISTORY: Past Surgical History:  Procedure Laterality Date  . childbirth  [id number]   admissions  . DILATION AND CURETTAGE OF UTERUS    . KNEE SURGERY Left 09-01-13   1'14  . SKIN CANCER EXCISION     right nasal bridge  . TONSILLECTOMY    . TOTAL HIP ARTHROPLASTY Left 09/08/2013   Procedure: LEFT TOTAL HIP ARTHROPLASTY ANTERIOR APPROACH;  Surgeon: Mauri Pole, MD;  Location: [facility];  Service: Orthopedics;  Laterality: Left;    FAMILY HISTORY: Family History  Problem Relation Age of Onset  . Heart disease Mother   . Cancer Other     SOCIAL HISTORY: Social History   Social History  . Marital status: Single    Spouse name: N/A  .  Number of children: 2  . 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 Smoking status: Never Smoker  . Smokeless tobacco: Never Used  . Alcohol use No  . Drug use: No  . Sexual activity: Yes   Other Topics Concern  . Not on file   Social History Narrative   Patient is single with 2 children.   Patient is right handed   Patient has a Bachelor's degree.   Patient drinks 1 cup daily.      PHYSICAL EXAM  Vitals:   10/30/16 1316  BP: 112/66  Pulse: 77  Weight: 133 lb 9.6 oz (60.6 kg)  Height: 5\' 4"  (1.626 m)   Body mass index is 22.93 kg/m.  Generalized: Well developed, in no acute distress   Neurological examination  Mentation: Alert oriented to time, place, history taking. Follows all commands speech and language fluent Cranial nerve II-XII: Pupils were equal round reactive to light. Extraocular movements were full, visual field were full on confrontational test. Facial sensation and strength were normal. Uvula tongue midline. Head turning and shoulder shrug  were normal and symmetric. Motor: The motor testing reveals 5 over 5 strength of all 4 extremities. Good symmetric motor tone is noted  throughout.  Sensory: Sensory testing is intact to soft touch on all 4 extremities. No evidence of extinction is noted.  Coordination: Cerebellar testing reveals good finger-nose-finger and heel-to-shin bilaterally.  Gait and station: Sitting posture due to scoliosis. Gait is normal. Tandem gait not attempted. Romberg is negative Reflexes: Deep tendon reflexes are symmetric and normal bilaterally.   DIAGNOSTIC DATA (LABS, IMAGING, TESTING) - I reviewed patient records, labs, notes, testing and imaging myself where available.  Lab Results  Component Value Date   WBC 4.6 05/31/2015   HGB 10.8 (L) 05/31/2015   HCT 32.0 (L) 05/31/2015   MCV 91.7 05/31/2015   PLT 221 05/31/2015      Component Value Date/Time   NA 137 05/31/2015 1309   K 3.9 05/31/2015 1309   CL 98 08/11/2014 1425   CO2 29 05/31/2015 1309   GLUCOSE 85 05/31/2015 1309   BUN 26.5 (H) 05/31/2015 1309   CREATININE 1.5 (H) 05/31/2015 1309   CALCIUM 10.0 05/31/2015 1309   PROT 7.9 05/31/2015 1309   ALBUMIN 3.6 05/31/2015 1309   AST 30 05/31/2015 1309   ALT 15 05/31/2015 1309   ALKPHOS 98 05/31/2015 1309   BILITOT 0.34 05/31/2015 1309   GFRNONAA 48 (L) 08/11/2014 1425   GFRAA 55 (L) 08/11/2014 1425      ASSESSMENT AND PLAN 70 y.o. year old female  has a past medical history of AC (acromioclavicular) joint bone spurs; Arthritis; Autoimmune disease, not elsewhere classified(279.49) (08/04/2013); Bursitis; Chronic pain; Degenerative joint disease; Goiter, nontoxic, multinodular; High cholesterol; Hypertension; Lupus; Multiple sclerosis ([place]); Osteoarthritis; PONV (postoperative nausea and vomiting); Scoliosis; Sjogren's disease ([place]) (09/2014); Skin cancer; and Torn meniscus (1/14). here with:  1. Sciatica 2. sjoegren's disease 3. Scoliosis 4. Multiple sclerosis  Overall the patient is doing well. She will continue on gabapentin to control her sciatic pain. Continue on Provigil for daytime sleepiness. Advised that if her  symptoms worsen or she develops any new symptoms she should let us know. Follow-up in one year with Dr. Brett Fairy.     Ward Givens, MSN, NP-C 10/30/2016, 1:19 PM [facility] [address], [place], [place] [address] [phone]  [phone]   

## 2016-10-30 NOTE — Progress Notes (Signed)
I agree with the assessment and plan as directed by NP .The patient is known to me .   Michele Folse, MD  

## 2016-10-30 NOTE — Patient Instructions (Signed)
Continue gabapentin and provigil  If your symptoms worsen or you develop new symptoms please let us know.

## 2016-11-14 ENCOUNTER — Encounter: Payer: Self-pay | Admitting: *Deleted

## 2016-11-14 DIAGNOSIS — M5430 Sciatica, unspecified side: Secondary | ICD-10-CM | POA: Insufficient documentation

## 2016-11-15 ENCOUNTER — Telehealth: Payer: Self-pay | Admitting: Adult Health

## 2016-11-15 NOTE — Telephone Encounter (Signed)
Derrick with [employer] called to give authorization for patients RX for Neurontin 300mg , has been approved from 11/14/16-11/14/17.

## 2016-11-15 NOTE — Telephone Encounter (Signed)
Noted  

## 2016-12-05 DIAGNOSIS — H1851 Endothelial corneal dystrophy: Secondary | ICD-10-CM | POA: Diagnosis not present

## 2016-12-05 DIAGNOSIS — H5213 Myopia, bilateral: Secondary | ICD-10-CM | POA: Diagnosis not present

## 2016-12-05 DIAGNOSIS — H524 Presbyopia: Secondary | ICD-10-CM | POA: Diagnosis not present

## 2016-12-05 DIAGNOSIS — H52223 Regular astigmatism, bilateral: Secondary | ICD-10-CM | POA: Diagnosis not present

## 2016-12-05 DIAGNOSIS — Z79899 Other long term (current) drug therapy: Secondary | ICD-10-CM | POA: Diagnosis not present

## 2016-12-05 DIAGNOSIS — H2513 Age-related nuclear cataract, bilateral: Secondary | ICD-10-CM | POA: Diagnosis not present

## 2016-12-05 DIAGNOSIS — H04123 Dry eye syndrome of bilateral lacrimal glands: Secondary | ICD-10-CM | POA: Diagnosis not present

## 2016-12-10 DIAGNOSIS — G35 Multiple sclerosis: Secondary | ICD-10-CM | POA: Diagnosis not present

## 2016-12-10 DIAGNOSIS — I10 Essential (primary) hypertension: Secondary | ICD-10-CM | POA: Diagnosis not present

## 2016-12-10 DIAGNOSIS — E7800 Pure hypercholesterolemia, unspecified: Secondary | ICD-10-CM | POA: Diagnosis not present

## 2016-12-24 DIAGNOSIS — N3942 Incontinence without sensory awareness: Secondary | ICD-10-CM | POA: Diagnosis not present

## 2016-12-24 DIAGNOSIS — N3281 Overactive bladder: Secondary | ICD-10-CM | POA: Diagnosis not present

## 2017-01-02 DIAGNOSIS — N179 Acute kidney failure, unspecified: Secondary | ICD-10-CM | POA: Diagnosis not present

## 2017-01-02 DIAGNOSIS — L932 Other local lupus erythematosus: Secondary | ICD-10-CM | POA: Diagnosis not present

## 2017-01-02 DIAGNOSIS — Z79899 Other long term (current) drug therapy: Secondary | ICD-10-CM | POA: Diagnosis not present

## 2017-01-02 DIAGNOSIS — I10 Essential (primary) hypertension: Secondary | ICD-10-CM | POA: Diagnosis not present

## 2017-01-30 DIAGNOSIS — L930 Discoid lupus erythematosus: Secondary | ICD-10-CM | POA: Diagnosis not present

## 2017-02-12 DIAGNOSIS — M65341 Trigger finger, right ring finger: Secondary | ICD-10-CM | POA: Diagnosis not present

## 2017-03-08 DIAGNOSIS — M545 Low back pain: Secondary | ICD-10-CM | POA: Diagnosis not present

## 2017-03-08 DIAGNOSIS — G8929 Other chronic pain: Secondary | ICD-10-CM | POA: Diagnosis not present

## 2017-03-08 DIAGNOSIS — M4135 Thoracogenic scoliosis, thoracolumbar region: Secondary | ICD-10-CM | POA: Diagnosis not present

## 2017-04-18 ENCOUNTER — Telehealth: Payer: Self-pay | Admitting: Adult Health

## 2017-04-18 NOTE — Telephone Encounter (Signed)
[facility] [phone] opt 5 called to advise modafinil (PROVIGIL) 200 MG tablet is unable to be approved. It is not being prescribed in accordance with an FDA labeled use. Pt has been notified. A Letter will mailed

## 2017-04-18 NOTE — Telephone Encounter (Signed)
LMVM for pt that received notice from [employer] modafinil not approved.  Looks like this has happened before, will appeal.

## 2017-04-19 NOTE — Telephone Encounter (Signed)
LMVM mobile about her modafinil.

## 2017-04-24 NOTE — Telephone Encounter (Signed)
Spoke to pt and relayed that denied per [facility].  Pt has had this issue every year now and appeal is done (stating that she needs this medication for fatigue) and is usually approved.  I told her that [facility] now more strict and most likely will not approve and she then will use [facility].

## 2017-04-24 NOTE — Telephone Encounter (Signed)
Spoke with other RN's and [employer] strict now in their guidelines about this medication being covered by insurance.  Recommend to use [facility] 90 day supply.

## 2017-04-24 NOTE — Telephone Encounter (Signed)
LMVM for pt on her home and mobile to return call.

## 2017-04-26 ENCOUNTER — Encounter: Payer: Self-pay | Admitting: *Deleted

## 2017-04-26 NOTE — Telephone Encounter (Signed)
Appeal letter for modafinil denial placed on Dr. Edwena Felty desk for signature,

## 2017-04-29 NOTE — Telephone Encounter (Signed)
Faxed to [facility] Appeal letter for modafinail (received fax confirmation).

## 2017-04-29 NOTE — Telephone Encounter (Signed)
Michele Walton with [facility] Appeal called to advise she received the appeal for modafinil (PROVIGIL) 200 MG tablet and there is a 7 day turnaround time. A returned call is not needed unless questions.

## 2017-04-30 ENCOUNTER — Telehealth: Payer: Self-pay | Admitting: Neurology

## 2017-04-30 NOTE — Telephone Encounter (Addendum)
Noted, for rx modafinil.

## 2017-04-30 NOTE — Telephone Encounter (Signed)
Michele Walton from [place] [phone] called re: the Prior Michele Walton, it was approved from 04-18-2017 to 04-18-2018 [facility]

## 2017-05-06 ENCOUNTER — Other Ambulatory Visit: Payer: Self-pay | Admitting: Adult Health

## 2017-05-06 DIAGNOSIS — M4120 Other idiopathic scoliosis, site unspecified: Secondary | ICD-10-CM

## 2017-05-06 NOTE — Telephone Encounter (Signed)
Received letter from [facility] that modafinil approved 04/18/17-04/18/18.

## 2017-06-03 DIAGNOSIS — I10 Essential (primary) hypertension: Secondary | ICD-10-CM | POA: Diagnosis not present

## 2017-06-03 DIAGNOSIS — E7800 Pure hypercholesterolemia, unspecified: Secondary | ICD-10-CM | POA: Diagnosis not present

## 2017-06-03 DIAGNOSIS — G35 Multiple sclerosis: Secondary | ICD-10-CM | POA: Diagnosis not present

## 2017-06-03 DIAGNOSIS — N183 Chronic kidney disease, stage 3 (moderate): Secondary | ICD-10-CM | POA: Diagnosis not present

## 2017-06-14 ENCOUNTER — Telehealth: Payer: Self-pay | Admitting: Adult Health

## 2017-06-14 NOTE — Telephone Encounter (Signed)
I received a complete metabolic panel from the patient's primary care. Lab work was completed on 06/03/2017. CMP was unremarkable.

## 2017-06-19 DIAGNOSIS — H04129 Dry eye syndrome of unspecified lacrimal gland: Secondary | ICD-10-CM | POA: Diagnosis not present

## 2017-06-19 DIAGNOSIS — H5213 Myopia, bilateral: Secondary | ICD-10-CM | POA: Diagnosis not present

## 2017-06-19 DIAGNOSIS — H16143 Punctate keratitis, bilateral: Secondary | ICD-10-CM | POA: Diagnosis not present

## 2017-06-19 DIAGNOSIS — H4720 Unspecified optic atrophy: Secondary | ICD-10-CM | POA: Diagnosis not present

## 2017-06-19 DIAGNOSIS — G35 Multiple sclerosis: Secondary | ICD-10-CM | POA: Diagnosis not present

## 2017-06-19 DIAGNOSIS — H04123 Dry eye syndrome of bilateral lacrimal glands: Secondary | ICD-10-CM | POA: Diagnosis not present

## 2017-06-19 DIAGNOSIS — H52223 Regular astigmatism, bilateral: Secondary | ICD-10-CM | POA: Diagnosis not present

## 2017-06-19 DIAGNOSIS — L209 Atopic dermatitis, unspecified: Secondary | ICD-10-CM | POA: Diagnosis not present

## 2017-08-06 DIAGNOSIS — L930 Discoid lupus erythematosus: Secondary | ICD-10-CM | POA: Diagnosis not present

## 2017-10-29 ENCOUNTER — Telehealth: Payer: Self-pay | Admitting: Neurology

## 2017-10-29 NOTE — Telephone Encounter (Signed)
Pt returned call and was able to get her rescheduled.

## 2017-10-29 NOTE — Telephone Encounter (Signed)
Our office attempted to reach out to the patient 3 times to change her schedule her due to the provider being out of the office on Dec 6. The patient has not answered our calls or called [place] back. If the patient calls back please reschedule.

## 2017-10-31 ENCOUNTER — Ambulatory Visit: Payer: Medicare Other | Admitting: Neurology

## 2017-11-13 ENCOUNTER — Other Ambulatory Visit: Payer: Self-pay | Admitting: Adult Health

## 2017-11-13 DIAGNOSIS — M4120 Other idiopathic scoliosis, site unspecified: Secondary | ICD-10-CM

## 2017-11-13 NOTE — Telephone Encounter (Signed)
Rx is ready at the front desk.

## 2017-11-13 NOTE — Telephone Encounter (Signed)
She has an appt with Dohmeier on 11/15/17

## 2017-11-15 ENCOUNTER — Encounter: Payer: Self-pay | Admitting: Neurology

## 2017-11-15 ENCOUNTER — Ambulatory Visit (INDEPENDENT_AMBULATORY_CARE_PROVIDER_SITE_OTHER): Payer: Medicare Other | Admitting: Neurology

## 2017-11-15 DIAGNOSIS — M3500 Sicca syndrome, unspecified: Secondary | ICD-10-CM | POA: Diagnosis not present

## 2017-11-15 DIAGNOSIS — G35 Multiple sclerosis: Secondary | ICD-10-CM | POA: Diagnosis not present

## 2017-11-15 DIAGNOSIS — M4120 Other idiopathic scoliosis, site unspecified: Secondary | ICD-10-CM

## 2017-11-15 MED ORDER — NEURONTIN 300 MG PO CAPS: 300.0000 mg | ORAL_CAPSULE | Freq: Three times a day (TID) | ORAL | 3 refills | Status: DC

## 2017-11-15 MED ORDER — MODAFINIL 200 MG PO TABS: 200.0000 mg | ORAL_TABLET | Freq: Every day | ORAL | 3 refills | Status: DC

## 2017-11-15 NOTE — Progress Notes (Signed)
PATIENT: Michele Walton DOB: 07-10-1946  REASON FOR VISIT: follow up- sjoegren's disease, scoliosis, sciatic pain multiple sclerosis  HISTORY FROM: patient  HISTORY OF PRESENT ILLNESS:  Michele Walton is a meanwhile 71 year old Caucasian right-handed female patient seen today on 15 November 2017.  She has a history of scoliosis with sciatic pain Sjogren's disease skin lupus followed visual for excessive daytime sleepiness.  She has reported that it has been working well for her she also had no relapses of lupus she has been without any relapsing symptoms or progressive symptoms for her presumed MS.     [name] is a 71 year old female with a history of Sjoegren's disease  disease, scoliosis, sciatic pain. She returns today for follow-up. She states overall she is doing well. She continues to use gabapentin for her sciatic pain and feels that this works well for her. She does state that she has some muscle pain related to scoliosis. She is able to complete all ADLs and apparently. She uses Provigil for excessive daytime sleepiness. Reports that this is working well for her. In the past the patient has been sitter for multiple sclerosis as her MRI of the spine shows demyelinating plaques however she has been negative for all oligoclonal bands and clinically has not presented with relapsing symptoms or progressive symptoms. She returns today for follow-up.  HISTORY 04/30/16: [name] Is a 71 year old female with a history of sjoegren disease's and scoliosis. She returns today for follow-up. Overall the patient states that she is doing well. She denies any new neurological symptoms. She states that she is currently not on any medication for her sjoegren's she states that most medications that was tried caused issues with her bladder. She was put on Biotene and feels like this works well for her dry mouth. She continues to use Provigil for excessive daytime sleepiness. She states that this also  works well. The patient is also on gabapentin for sciatic pain. She states that she has been on this for years. She states that her balance has improved. She denies any falls. She has noticed that her gait has improved with going up and down steps. She returns today for an evaluation.  HISTORY 10/31/15: [name] is a 71 year old female with a possible history of Multiple sclerosis. . She returns today for follow-up. Patient states that in the last 12 months she has had severe dry mouth and dry eyes. She states that her nose has been getting congested and feels like something is stuck way up in her head.  She states that her eyes are so dry that sometimes she can't open eyes without putting on some lubrication. She feels that the dry mouth is better manageable now than it was last year. When she was Walton diagnosed with MS in 1993 she had numbness and tingling in the right foot. Over time it progressed up her leg to her groin. She has never been on any disease modifying agents. She was given gabapentin, naprosyn and modafinil to help with her symptoms. She states that it progressed over the years and now she has some numbness and tingling from the waist down. She also has a history of lumbar scoliosis, kyphosis, spinal stenosis, osteoarthritis, bone spur on the left pelvis.  In 2013, she was diagnosed with cutaneous Lupus- she sees a dermatologist for this.  Since her last visit she has had some progression of her back discomfort unsure if its due to scoliosis/stenosis or MS. it is not clear to  me if [name] truly has MS but it is the assumption, given that she also has scoliosis and other problems that could explain several of her symptoms. Again she has not been on disease modifying drugs. She is chronically fatigued.  She has an incontinent bladder- this is not new. She is having some difficulty walking but thinks it's due to her anatomy.  She fell in 2014, she hit her head on the file  cabinet. No falls since     REVIEW OF SYSTEMS: Out of a complete 14 system review of symptoms, the patient complains only of the following symptoms, and all other reviewed systems are negative.  Light sensitivity, blurred vision, incontinence of bladder, joint pain, aching muscles  ALLERGIES: Allergies  Allergen Reactions  . Latex Rash    Peels her skin  . Percocet [Oxycodone-Acetaminophen] Nausea Only and Rash  . Sulfa Antibiotics Nausea Only and Rash  . Macrobid [Nitrofurantoin] Nausea Only  . Tape Rash    Tears skin    HOME MEDICATIONS: Outpatient Medications Prior to Visit  Medication Sig Dispense Refill  . aspirin EC 81 MG tablet Take 81 mg by mouth daily.    . benazepril-hydrochlorthiazide (LOTENSIN HCT) 20-25 MG per tablet Take 1 tablet by mouth every morning.    . calcium carbonate 200 MG capsule Take 200 mg by mouth daily.     . cholecalciferol (VITAMIN D) 1000 UNITS tablet Take 1,000 Units by mouth daily.    . Cyanocobalamin (VITAMIN B 12 PO) Take 1,000 mcg by mouth daily.     . fesoterodine (TOVIAZ) 4 MG TB24 Take 4 mg by mouth daily.    . hydroxychloroquine (PLAQUENIL) 200 MG tablet Take by mouth.    . modafinil (PROVIGIL) 200 MG tablet TAKE 1 TABLET BY MOUTH EVERY DAY. 30 tablet 5  . Multiple Vitamin (MULTI-VITAMINS) TABS Take by mouth.    . NEURONTIN 300 MG capsule TAKE 1 CAPSULE(300 MG) BY MOUTH THREE TIMES DAILY 90 capsule 11  . polyethylene glycol (MIRALAX / GLYCOLAX) packet Take 17 g by mouth daily as needed. 14 each 0  . vitamin E 400 UNIT capsule Take 400 Units by mouth daily.     No facility-administered medications prior to visit.     PAST MEDICAL HISTORY: Past Medical History:  Diagnosis Date  . AC (acromioclavicular) joint bone spurs   . Arthritis   . 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   Marland Kitchen Bursitis   . Chronic pain   .  Degenerative joint disease   . Goiter, nontoxic, multinodular   . High cholesterol   . Hypertension   . Lupus    hx. subcutaneous lupus.  . Multiple sclerosis ([place])   . Osteoarthritis   . PONV (postoperative nausea and vomiting)   . Scoliosis   . Sjogren's disease ([place]) 09/2014  . Skin cancer   . Torn meniscus 1/14   left knee    PAST SURGICAL HISTORY: Past Surgical History:  Procedure Laterality Date  . childbirth  [id number]   admissions  . DILATION AND CURETTAGE OF UTERUS    . KNEE SURGERY Left 09-01-13   1'14  . SKIN CANCER EXCISION     right nasal bridge  . TONSILLECTOMY    . TOTAL HIP ARTHROPLASTY Left 09/08/2013   Procedure: LEFT TOTAL HIP ARTHROPLASTY ANTERIOR APPROACH;  Surgeon: Mauri Pole, MD;  Location: [facility];  Service: Orthopedics;  Laterality: Left;    FAMILY HISTORY: Family History  Problem Relation Age of Onset  . Heart disease Mother   . Cancer Other     SOCIAL HISTORY: Social History   Socioeconomic History  . Marital status: Single    Spouse name: Not on file  . Number of children: 2  . Years of education: Bachelor  . Highest education level: Not on file  Social Needs  . Financial resource strain: Not on file  . Food insecurity - worry: Not on file  . Food insecurity - inability: Not on file  . Transportation needs - medical: Not on file  . Transportation needs - non-medical: Not on file  Occupational History  . Occupation: plan administer    Employer: [employer]    Comment: [employer]  Tobacco Use  . Smoking status: Never Smoker  . Smokeless tobacco: Never Used  Substance and Sexual Activity  . Alcohol use: No  . Drug use: No  . Sexual activity: Yes  Other Topics Concern  . Not on file  Social History Narrative   Patient is single with 2 children.   Patient is right handed   Patient has a Bachelor's degree.   Patient drinks 1 cup daily.      PHYSICAL EXAM  Vitals:   11/15/17 1103  BP: 105/65  Pulse: 76    Weight: 130 lb (59 kg)  Height: 5\' 1"  (1.549 m)   Body mass index is 24.56 kg/m.  Generalized: Well developed, in no acute distress   Neurological examination  Mentation: Alert oriented to time, place, history taking. Follows all commands speech and language fluent Cranial nerve II-XII: Pupils were equal round reactive to light. Extraocular movements were full, visual field were full on confrontational test. Facial sensation and strength were normal. Uvula tongue midline. Head turning and shoulder shrug  were normal and symmetric. Motor: The motor testing reveals 5 over 5 strength of all 4 extremities. Good symmetric motor tone is noted throughout.  Sensory: Sensory testing is intact to soft touch on all 4 extremities. No evidence of extinction is noted.  Coordination: Cerebellar testing reveals good finger-nose-finger and heel-to-shin bilaterally.  Gait and station: Sitting posture due to scoliosis. Gait is normal. Tandem gait not attempted. Romberg is negative Reflexes: Deep tendon reflexes are symmetric and normal bilaterally.   DIAGNOSTIC DATA (LABS, IMAGING, TESTING) - I reviewed patient records, labs, notes, testing and imaging myself where available.  Lab Results  Component Value Date   WBC 4.6 05/31/2015   HGB 10.8 (L) 05/31/2015   HCT 32.0 (L) 05/31/2015   MCV 91.7 05/31/2015   PLT 221 05/31/2015      Component Value Date/Time   NA 137 05/31/2015 1309   K 3.9 05/31/2015 1309   CL 98 08/11/2014 1425   CO2 29 05/31/2015 1309   GLUCOSE 85 05/31/2015 1309   BUN 26.5 (H) 05/31/2015 1309   CREATININE 1.5 (H) 05/31/2015 1309   CALCIUM 10.0 05/31/2015 1309   PROT 7.9 05/31/2015 1309   ALBUMIN 3.6 05/31/2015 1309   AST 30 05/31/2015 1309   ALT 15 05/31/2015 1309   ALKPHOS 98 05/31/2015 1309   BILITOT 0.34 05/31/2015 1309   GFRNONAA 48 (L) 08/11/2014 1425   GFRAA 55 (L) 08/11/2014 1425      ASSESSMENT AND PLAN 71 y.o. year old female  has a past medical history of  AC (acromioclavicular) joint bone spurs, Arthritis, Autoimmune disease, not elsewhere classified(279.49) (08/04/2013), Bursitis, Chronic pain, Degenerative joint  disease, Goiter, nontoxic, multinodular, High cholesterol, Hypertension, Lupus, Multiple sclerosis ([place]), Osteoarthritis, PONV (postoperative nausea and vomiting), Scoliosis, Sjogren's disease ([place]) (09/2014), Skin cancer, and Torn meniscus (1/14). here with:  1. Sciatica 2. sjoegren's disease 3. Scoliosis 4. Multiple sclerosis  Overall the patient is doing well. She will continue on gabapentin to control her sciatic pain. Continue on Provigil for daytime sleepiness. Advised that if her symptoms worsen or she develops any new symptoms she should let us know. Follow-up in one year with Dr. Brett Fairy.     Larey Seat, MD  11/15/2017, 11:20 AM [facility] [address] [place], [place] [address] [phone]

## 2017-11-28 ENCOUNTER — Telehealth: Payer: Self-pay | Admitting: *Deleted

## 2017-11-28 NOTE — Telephone Encounter (Signed)
Received fax from [place], [place]. Nonformulary excepton, PA approved for Neurontin 300 mg OR caps, authorized until 11/20/18. PA #[id number]

## 2017-12-04 DIAGNOSIS — I10 Essential (primary) hypertension: Secondary | ICD-10-CM | POA: Diagnosis not present

## 2017-12-04 DIAGNOSIS — G35 Multiple sclerosis: Secondary | ICD-10-CM | POA: Diagnosis not present

## 2017-12-04 DIAGNOSIS — N183 Chronic kidney disease, stage 3 (moderate): Secondary | ICD-10-CM | POA: Diagnosis not present

## 2017-12-04 DIAGNOSIS — E7800 Pure hypercholesterolemia, unspecified: Secondary | ICD-10-CM | POA: Diagnosis not present

## 2017-12-23 DIAGNOSIS — H00025 Hordeolum internum left lower eyelid: Secondary | ICD-10-CM | POA: Diagnosis not present

## 2017-12-25 DIAGNOSIS — R8271 Bacteriuria: Secondary | ICD-10-CM | POA: Diagnosis not present

## 2017-12-25 DIAGNOSIS — N3281 Overactive bladder: Secondary | ICD-10-CM | POA: Diagnosis not present

## 2018-01-14 DIAGNOSIS — H00022 Hordeolum internum right lower eyelid: Secondary | ICD-10-CM | POA: Diagnosis not present

## 2018-01-14 DIAGNOSIS — H00025 Hordeolum internum left lower eyelid: Secondary | ICD-10-CM | POA: Diagnosis not present

## 2018-02-03 DIAGNOSIS — L821 Other seborrheic keratosis: Secondary | ICD-10-CM | POA: Diagnosis not present

## 2018-02-03 DIAGNOSIS — L930 Discoid lupus erythematosus: Secondary | ICD-10-CM | POA: Diagnosis not present

## 2018-02-03 DIAGNOSIS — L570 Actinic keratosis: Secondary | ICD-10-CM | POA: Diagnosis not present

## 2018-02-03 DIAGNOSIS — Z23 Encounter for immunization: Secondary | ICD-10-CM | POA: Diagnosis not present

## 2018-02-03 DIAGNOSIS — D1801 Hemangioma of skin and subcutaneous tissue: Secondary | ICD-10-CM | POA: Diagnosis not present

## 2018-02-03 DIAGNOSIS — L814 Other melanin hyperpigmentation: Secondary | ICD-10-CM | POA: Diagnosis not present

## 2018-02-03 DIAGNOSIS — D225 Melanocytic nevi of trunk: Secondary | ICD-10-CM | POA: Diagnosis not present

## 2018-02-14 DIAGNOSIS — Z96642 Presence of left artificial hip joint: Secondary | ICD-10-CM | POA: Diagnosis not present

## 2018-02-14 DIAGNOSIS — M5136 Other intervertebral disc degeneration, lumbar region: Secondary | ICD-10-CM | POA: Diagnosis not present

## 2018-02-14 DIAGNOSIS — M545 Low back pain: Secondary | ICD-10-CM | POA: Diagnosis not present

## 2018-02-14 DIAGNOSIS — M4186 Other forms of scoliosis, lumbar region: Secondary | ICD-10-CM | POA: Diagnosis not present

## 2018-04-01 DIAGNOSIS — R05 Cough: Secondary | ICD-10-CM | POA: Diagnosis not present

## 2018-04-01 DIAGNOSIS — R509 Fever, unspecified: Secondary | ICD-10-CM | POA: Diagnosis not present

## 2018-04-14 ENCOUNTER — Other Ambulatory Visit: Payer: Self-pay | Admitting: Adult Health

## 2018-04-28 ENCOUNTER — Other Ambulatory Visit: Payer: Self-pay | Admitting: Adult Health

## 2018-04-29 DIAGNOSIS — Z79899 Other long term (current) drug therapy: Secondary | ICD-10-CM | POA: Diagnosis not present

## 2018-04-29 DIAGNOSIS — K130 Diseases of lips: Secondary | ICD-10-CM | POA: Diagnosis not present

## 2018-04-29 DIAGNOSIS — L570 Actinic keratosis: Secondary | ICD-10-CM | POA: Diagnosis not present

## 2018-04-29 DIAGNOSIS — L930 Discoid lupus erythematosus: Secondary | ICD-10-CM | POA: Diagnosis not present

## 2018-05-15 DIAGNOSIS — D631 Anemia in chronic kidney disease: Secondary | ICD-10-CM | POA: Diagnosis not present

## 2018-05-15 DIAGNOSIS — Z79899 Other long term (current) drug therapy: Secondary | ICD-10-CM | POA: Diagnosis not present

## 2018-05-15 DIAGNOSIS — I10 Essential (primary) hypertension: Secondary | ICD-10-CM | POA: Diagnosis not present

## 2018-05-15 DIAGNOSIS — N179 Acute kidney failure, unspecified: Secondary | ICD-10-CM | POA: Diagnosis not present

## 2018-05-15 DIAGNOSIS — L932 Other local lupus erythematosus: Secondary | ICD-10-CM | POA: Diagnosis not present

## 2018-06-06 DIAGNOSIS — H35033 Hypertensive retinopathy, bilateral: Secondary | ICD-10-CM | POA: Diagnosis not present

## 2018-06-06 DIAGNOSIS — H1851 Endothelial corneal dystrophy: Secondary | ICD-10-CM | POA: Diagnosis not present

## 2018-06-06 DIAGNOSIS — I10 Essential (primary) hypertension: Secondary | ICD-10-CM | POA: Diagnosis not present

## 2018-06-06 DIAGNOSIS — H04123 Dry eye syndrome of bilateral lacrimal glands: Secondary | ICD-10-CM | POA: Diagnosis not present

## 2018-06-06 DIAGNOSIS — H16143 Punctate keratitis, bilateral: Secondary | ICD-10-CM | POA: Diagnosis not present

## 2018-06-06 DIAGNOSIS — H52223 Regular astigmatism, bilateral: Secondary | ICD-10-CM | POA: Diagnosis not present

## 2018-06-06 DIAGNOSIS — H524 Presbyopia: Secondary | ICD-10-CM | POA: Diagnosis not present

## 2018-06-06 DIAGNOSIS — G35 Multiple sclerosis: Secondary | ICD-10-CM | POA: Diagnosis not present

## 2018-06-06 DIAGNOSIS — H5213 Myopia, bilateral: Secondary | ICD-10-CM | POA: Diagnosis not present

## 2018-06-06 DIAGNOSIS — M3501 Sicca syndrome with keratoconjunctivitis: Secondary | ICD-10-CM | POA: Diagnosis not present

## 2018-06-19 DIAGNOSIS — I10 Essential (primary) hypertension: Secondary | ICD-10-CM | POA: Diagnosis not present

## 2018-06-19 DIAGNOSIS — E7800 Pure hypercholesterolemia, unspecified: Secondary | ICD-10-CM | POA: Diagnosis not present

## 2018-06-19 DIAGNOSIS — N183 Chronic kidney disease, stage 3 (moderate): Secondary | ICD-10-CM | POA: Diagnosis not present

## 2018-06-19 DIAGNOSIS — E042 Nontoxic multinodular goiter: Secondary | ICD-10-CM | POA: Diagnosis not present

## 2018-06-19 DIAGNOSIS — G35 Multiple sclerosis: Secondary | ICD-10-CM | POA: Diagnosis not present

## 2018-06-19 DIAGNOSIS — E2839 Other primary ovarian failure: Secondary | ICD-10-CM | POA: Diagnosis not present

## 2018-06-27 ENCOUNTER — Other Ambulatory Visit: Payer: Self-pay | Admitting: Family Medicine

## 2018-06-27 DIAGNOSIS — E2839 Other primary ovarian failure: Secondary | ICD-10-CM

## 2018-07-03 DIAGNOSIS — M3501 Sicca syndrome with keratoconjunctivitis: Secondary | ICD-10-CM | POA: Diagnosis not present

## 2018-07-03 DIAGNOSIS — G35 Multiple sclerosis: Secondary | ICD-10-CM | POA: Diagnosis not present

## 2018-07-03 DIAGNOSIS — H16143 Punctate keratitis, bilateral: Secondary | ICD-10-CM | POA: Diagnosis not present

## 2018-07-03 DIAGNOSIS — H04123 Dry eye syndrome of bilateral lacrimal glands: Secondary | ICD-10-CM | POA: Diagnosis not present

## 2018-07-03 DIAGNOSIS — H1851 Endothelial corneal dystrophy: Secondary | ICD-10-CM | POA: Diagnosis not present

## 2018-08-19 DIAGNOSIS — Z1231 Encounter for screening mammogram for malignant neoplasm of breast: Secondary | ICD-10-CM | POA: Diagnosis not present

## 2018-08-19 DIAGNOSIS — Z01419 Encounter for gynecological examination (general) (routine) without abnormal findings: Secondary | ICD-10-CM | POA: Diagnosis not present

## 2018-10-16 ENCOUNTER — Other Ambulatory Visit: Payer: Self-pay | Admitting: Neurology

## 2018-10-16 DIAGNOSIS — M4120 Other idiopathic scoliosis, site unspecified: Secondary | ICD-10-CM

## 2018-10-20 ENCOUNTER — Telehealth: Payer: Self-pay | Admitting: Neurology

## 2018-10-20 NOTE — Telephone Encounter (Signed)
PA Modafinil completed through cover my meds/[employer] medicare plan.  KEY: [id number]  Should hear something in 3 days.

## 2018-10-21 NOTE — Telephone Encounter (Signed)
PA approved through 10/21/2019 through [facility]. Ernest [id number]

## 2018-11-06 DIAGNOSIS — Z872 Personal history of diseases of the skin and subcutaneous tissue: Secondary | ICD-10-CM | POA: Diagnosis not present

## 2018-11-06 DIAGNOSIS — K130 Diseases of lips: Secondary | ICD-10-CM | POA: Diagnosis not present

## 2018-11-17 ENCOUNTER — Ambulatory Visit: Payer: Medicare Other | Admitting: Adult Health

## 2018-11-24 ENCOUNTER — Ambulatory Visit (INDEPENDENT_AMBULATORY_CARE_PROVIDER_SITE_OTHER): Payer: Medicare Other | Admitting: Adult Health

## 2018-11-24 ENCOUNTER — Encounter: Payer: Self-pay | Admitting: Adult Health

## 2018-11-24 VITALS — BP 127/67 | HR 76 | Ht 61.0 in | Wt 124.0 lb

## 2018-11-24 DIAGNOSIS — I10 Essential (primary) hypertension: Secondary | ICD-10-CM | POA: Diagnosis not present

## 2018-11-24 DIAGNOSIS — M4120 Other idiopathic scoliosis, site unspecified: Secondary | ICD-10-CM

## 2018-11-24 DIAGNOSIS — R400 Somnolence: Secondary | ICD-10-CM | POA: Diagnosis not present

## 2018-11-24 DIAGNOSIS — G35 Multiple sclerosis: Secondary | ICD-10-CM | POA: Diagnosis not present

## 2018-11-24 DIAGNOSIS — M3500 Sicca syndrome, unspecified: Secondary | ICD-10-CM

## 2018-11-24 DIAGNOSIS — N183 Chronic kidney disease, stage 3 (moderate): Secondary | ICD-10-CM | POA: Diagnosis not present

## 2018-11-24 DIAGNOSIS — E7800 Pure hypercholesterolemia, unspecified: Secondary | ICD-10-CM | POA: Diagnosis not present

## 2018-11-24 MED ORDER — NEURONTIN 300 MG PO CAPS: 300.0000 mg | ORAL_CAPSULE | Freq: Three times a day (TID) | ORAL | 3 refills | Status: DC

## 2018-11-24 NOTE — Progress Notes (Signed)
PATIENT: Michele Walton DOB: 1946-04-07  REASON FOR VISIT: follow up HISTORY FROM: patient  HISTORY OF PRESENT ILLNESS: Today 11/24/18: [name] is a 72 year old female with a history of sjoegren's disease, scoliosis with sciatica, daytime sleepiness and multiple sclerosis.  She reports overall she has been doing well.  She reports that modafinil works well for her daytime sleepiness.  She likes the TEVA brand.  She continues on gabapentin for back pain and reports that she is not had any significant back pain.  She denies any signs of multiple sclerosis.  No change in her gait or balance.  No change in bowels or bladder.  No new numbness or weakness.  No changes with her vision.  She returns today for evaluation.  She returns today for follow-up.  HISTORY [name] Is a 72 year old female with a history of sjoegren disease's and scoliosis. She returns today for follow-up. Overall the patient states that she is doing well. She denies any new neurological symptoms. She states that she is currently not on any medication for her sjoegren's she states that most medications that was tried caused issues with her bladder. She was put on Biotene and feels like this works well for her dry mouth. She continues to use Provigil for excessive daytime sleepiness. She states that this also works well. The patient is also on gabapentin for sciatic pain. She states that she has been on this for years. She states that her balance has improved. She denies any falls. She has noticed that her gait has improved with going up and down steps. She returns today for an evaluation  REVIEW OF SYSTEMS: Out of a complete 14 system review of symptoms, the patient complains only of the following symptoms, and all other reviewed systems are negative.  See HPI  ALLERGIES: Allergies  Allergen Reactions  . Latex Rash    Peels her skin  . Percocet [Oxycodone-Acetaminophen] Nausea Only and Rash  . Sulfa Antibiotics Nausea  Only and Rash  . Macrobid [Nitrofurantoin] Nausea Only  . Tape Rash    Tears skin    HOME MEDICATIONS: Outpatient Medications Prior to Visit  Medication Sig Dispense Refill  . benazepril-hydrochlorthiazide (LOTENSIN HCT) 20-25 MG per tablet Take 1 tablet by mouth every morning.    . calcium carbonate 200 MG capsule Take 200 mg by mouth daily.     . cholecalciferol (VITAMIN D) 1000 UNITS tablet Take 1,000 Units by mouth daily.    . Cyanocobalamin (VITAMIN B 12 PO) Take 1,000 mcg by mouth daily.     . fesoterodine (TOVIAZ) 4 MG TB24 Take 4 mg by mouth daily.    . hydroxychloroquine (PLAQUENIL) 200 MG tablet Take by mouth.    . modafinil (PROVIGIL) 200 MG tablet TAKE 1 TABLET BY MOUTH DAILY 90 tablet 0  . Multiple Vitamin (MULTI-VITAMINS) TABS Take by mouth.    . NEURONTIN 300 MG capsule Take 1 capsule (300 mg total) by mouth 3 (three) times daily. 270 capsule 3  . vitamin E 400 UNIT capsule Take 400 Units by mouth daily.    Marland Kitchen aspirin EC 81 MG tablet Take 81 mg by mouth daily.    . polyethylene glycol (MIRALAX / GLYCOLAX) packet Take 17 g by mouth daily as needed. (Patient not taking: Reported on 11/24/2018) 14 each 0   No facility-administered medications prior to visit.     PAST MEDICAL HISTORY: Past Medical History:  Diagnosis Date  . AC (acromioclavicular) joint bone spurs   . Arthritis   .  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   Marland Kitchen Bursitis   . Chronic pain   . Degenerative joint disease   . Goiter, nontoxic, multinodular   . High cholesterol   . Hypertension   . Lupus ([facility])    hx. subcutaneous lupus.  . Multiple sclerosis ([place])   . Osteoarthritis   . PONV (postoperative nausea and vomiting)   . Scoliosis   . Sjogren's disease ([place]) 09/2014  . Skin cancer   . Torn meniscus 1/14   left knee    PAST SURGICAL HISTORY: Past Surgical History:  Procedure  Laterality Date  . childbirth  0630,1601   admissions  . DILATION AND CURETTAGE OF UTERUS    . KNEE SURGERY Left 09-01-13   1'14  . SKIN CANCER EXCISION     right nasal bridge  . TONSILLECTOMY    . TOTAL HIP ARTHROPLASTY Left 09/08/2013   Procedure: LEFT TOTAL HIP ARTHROPLASTY ANTERIOR APPROACH;  Surgeon: Mauri Pole, MD;  Location: [facility];  Service: Orthopedics;  Laterality: Left;    FAMILY HISTORY: Family History  Problem Relation Age of Onset  . Heart disease Mother   . Cancer Other     SOCIAL HISTORY: Social History   Socioeconomic History  . Marital status: Single    Spouse name: Not on file  . Number of children: 2  . Years of education: Bachelor  . Highest education level: Not on file  Occupational History  . Occupation: plan administer    Employer: [employer]    Comment: [employer]  Social Needs  . Financial resource strain: Not on file  . Food insecurity:    Worry: Not on file    Inability: Not on file  . Transportation needs:    Medical: Not on file    Non-medical: Not on file  Tobacco Use  . Smoking status: Never Smoker  . Smokeless tobacco: Never Used  Substance and Sexual Activity  . Alcohol use: No  . Drug use: No  . Sexual activity: Yes  Lifestyle  . Physical activity:    Days per week: Not on file    Minutes per session: Not on file  . Stress: Not on file  Relationships  . Social connections:    Talks on phone: Not on file    Gets together: Not on file    Attends religious service: Not on file    Active member of club or organization: Not on file    Attends meetings of clubs or organizations: Not on file    Relationship status: Not on file  . Intimate partner violence:    Fear of current or ex partner: Not on file    Emotionally abused: Not on file    Physically abused: Not on file    Forced sexual activity: Not on file  Other Topics Concern  . Not on file  Social History Narrative   Patient is single with 2 children.    Patient is right handed   Patient has a Bachelor's degree.   Patient drinks 1 cup daily.      PHYSICAL EXAM  Vitals:   11/24/18 1334  BP: 127/67  Pulse: 76  Weight: 124 lb (56.2 kg)  Height: 5\' 1"  (1.549 m)   Body mass index is 23.43 kg/m.  Generalized: Well developed, in no acute distress   Neurological examination  Mentation: Alert oriented to  time, place, history taking. Follows all commands speech and language fluent Cranial nerve II-XII: Pupils were equal round reactive to light. Extraocular movements were full, visual field were full on confrontational test. Facial sensation and strength were normal. Uvula tongue midline. Head turning and shoulder shrug  were normal and symmetric. Motor: The motor testing reveals 5 over 5 strength of all 4 extremities. Good symmetric motor tone is noted throughout.  Sensory: Sensory testing is intact to soft touch on all 4 extremities. No evidence of extinction is noted.  Coordination: Cerebellar testing reveals good finger-nose-finger and heel-to-shin bilaterally.  Gait and station: Gait is normal.  Reflexes: Deep tendon reflexes are symmetric and normal bilaterally.   DIAGNOSTIC DATA (LABS, IMAGING, TESTING) - I reviewed patient records, labs, notes, testing and imaging myself where available.  Lab Results  Component Value Date   WBC 4.6 05/31/2015   HGB 10.8 (L) 05/31/2015   HCT 32.0 (L) 05/31/2015   MCV 91.7 05/31/2015   PLT 221 05/31/2015      Component Value Date/Time   NA 137 05/31/2015 1309   K 3.9 05/31/2015 1309   CL 98 08/11/2014 1425   CO2 29 05/31/2015 1309   GLUCOSE 85 05/31/2015 1309   BUN 26.5 (H) 05/31/2015 1309   CREATININE 1.5 (H) 05/31/2015 1309   CALCIUM 10.0 05/31/2015 1309   PROT 7.9 05/31/2015 1309   ALBUMIN 3.6 05/31/2015 1309   AST 30 05/31/2015 1309   ALT 15 05/31/2015 1309   ALKPHOS 98 05/31/2015 1309   BILITOT 0.34 05/31/2015 1309   GFRNONAA 48 (L) 08/11/2014 1425   GFRAA 55 (L) 08/11/2014  1425   No results found for: CHOL, HDL, LDLCALC, LDLDIRECT, TRIG, CHOLHDL No results found for: HGBA1C No results found for: VITAMINB12 No results found for: TSH    ASSESSMENT AND PLAN 72 y.o. year old female  has a past medical history of AC (acromioclavicular) joint bone spurs, Arthritis, Autoimmune disease, not elsewhere classified(279.49) (08/04/2013), Bursitis, Chronic pain, Degenerative joint disease, Goiter, nontoxic, multinodular, High cholesterol, Hypertension, Lupus ([place]), Multiple sclerosis ([place]), Osteoarthritis, PONV (postoperative nausea and vomiting), Scoliosis, Sjogren's disease ([place]) (09/2014), Skin cancer, and Torn meniscus (1/14). here with:  1.  Sjogren's disease 2.  Scoliosis 3.  Multiple sclerosis 4.  Excessive daytime sleepiness  Overall the patient has remained relatively stable.  She will continue on modafinil and gabapentin.  The patient has never been on any disease modifying therapy for MS.  We will continue to monitor.  She will follow-up in 1 year or sooner if needed.    Ward Givens, MSN, NP-C 11/24/2018, 1:39 PM [facility] [address] [place], [place] [address] [phone]

## 2018-11-24 NOTE — Patient Instructions (Signed)
Your Plan:  Continue Gabapentin and Modafinil  If your symptoms worsen or you develop new symptoms please let us know.   Thank you for coming to see [place] at [facility]. I hope we have been able to provide you high quality care today.  You may receive a patient satisfaction survey over the next few weeks. We would appreciate your feedback and comments so that we may continue to improve ourselves and the health of our patients.

## 2018-11-27 ENCOUNTER — Telehealth: Payer: Self-pay | Admitting: *Deleted

## 2018-11-27 NOTE — Telephone Encounter (Signed)
Received fax from [facility] re: PA needed for neurontin. [place], Medicare, spoke with [place] W who stated if patient gets gabapentin, she is within quantity limits, no PA needed. If patient wants neurontin, it's a formulary exception, PA needed.   Zenna called pharmacy who stated the current prescription on file stated Neurontin only, no generic. Pharmacy stated if patient can take gabapentin a new Rx can be e scribed, and no PA will be needed.  This RN will send to NP for new Rx if appropriate.  If not appropriate, this RN will call [employer] to do PA on Neurontin.

## 2018-11-27 NOTE — Telephone Encounter (Signed)
I think the patient requested Brand Name. Call to verify.

## 2018-11-28 ENCOUNTER — Ambulatory Visit
Admission: RE | Admit: 2018-11-28 | Discharge: 2018-11-28 | Disposition: A | Discharge status: Home / Self Care | Payer: Medicare Other | Source: Ambulatory Visit | Attending: Family Medicine | Admitting: Family Medicine

## 2018-11-28 DIAGNOSIS — E2839 Other primary ovarian failure: Secondary | ICD-10-CM

## 2018-11-28 DIAGNOSIS — M85851 Other specified disorders of bone density and structure, right thigh: Secondary | ICD-10-CM | POA: Diagnosis not present

## 2018-11-28 DIAGNOSIS — Z780 Asymptomatic menopausal state: Secondary | ICD-10-CM | POA: Diagnosis not present

## 2018-11-28 NOTE — Telephone Encounter (Signed)
Spoke with patient who stated gabapentin did not work for her. She does request brand Neurontin. This RN advised her a PA will have to be done, may take several days. She checked her bottle and stated she has 1 month's supply left. This RN advised will get PA done in plenty of time before her next refill is due. Patient verbalized understanding, appreciation.

## 2018-11-28 NOTE — Telephone Encounter (Signed)
[place], spoke with Shawna to initiate neurontin PA. She stated that the patient's insurance coverage expired on 11/25/18, and the patient did not renew [employer].  Called patient who stated she would have to call [employer] to rectify, stated she still has insurance. She did not receive a new card, stated she never got card last year.  This RN advised her the office is now closed, and please call back Monday with her information. She verbalized understanding, agreement.

## 2018-12-01 NOTE — Telephone Encounter (Addendum)
Received call back from patient who stated she has a separate drug plan: [facility] Medicare Rx Enhanced. ID [id number] Rx Kara Dies [id number]   Grp [id number]   Rx Earl Many   PCN [facility]   Plan # [id number]  Advised patient this RN will attempt PA for brand Neurontin with this new information. Requested she bring drug card in to be scanned at her next appointment. Patient verbalized understanding, appreciation.  Began PA on CMM, key- [id number]. Your information has been submitted to [place]. [employer] [place] will review the request and notify you of the determination decision directly, typically within 3 business days of your submission and once all necessary information is received.

## 2018-12-01 NOTE — Telephone Encounter (Signed)
LVM advising this RN was calling to see if she had reached [employer] about her insurance coverage.  Requested patient call back, left office number.

## 2018-12-02 NOTE — Telephone Encounter (Addendum)
Received notice on CMM: Neurontin brand approved, BCBS- Effective from 12/01/2018 through 12/02/2019.  LVM for patient advising her of approval. Advised she call [facility]. Left office number for any questions.

## 2019-01-13 DIAGNOSIS — N3281 Overactive bladder: Secondary | ICD-10-CM | POA: Diagnosis not present

## 2019-06-01 DIAGNOSIS — L930 Discoid lupus erythematosus: Secondary | ICD-10-CM | POA: Diagnosis not present

## 2019-06-01 DIAGNOSIS — L821 Other seborrheic keratosis: Secondary | ICD-10-CM | POA: Diagnosis not present

## 2019-06-01 DIAGNOSIS — L814 Other melanin hyperpigmentation: Secondary | ICD-10-CM | POA: Diagnosis not present

## 2019-06-01 DIAGNOSIS — Z79899 Other long term (current) drug therapy: Secondary | ICD-10-CM | POA: Diagnosis not present

## 2019-06-01 DIAGNOSIS — D225 Melanocytic nevi of trunk: Secondary | ICD-10-CM | POA: Diagnosis not present

## 2019-06-01 DIAGNOSIS — L570 Actinic keratosis: Secondary | ICD-10-CM | POA: Diagnosis not present

## 2019-06-02 ENCOUNTER — Other Ambulatory Visit: Payer: Self-pay | Admitting: Neurology

## 2019-06-02 DIAGNOSIS — M4120 Other idiopathic scoliosis, site unspecified: Secondary | ICD-10-CM

## 2019-06-03 ENCOUNTER — Other Ambulatory Visit: Payer: Self-pay | Admitting: Neurology

## 2019-07-07 DIAGNOSIS — D638 Anemia in other chronic diseases classified elsewhere: Secondary | ICD-10-CM | POA: Diagnosis not present

## 2019-07-07 DIAGNOSIS — N183 Chronic kidney disease, stage 3 (moderate): Secondary | ICD-10-CM | POA: Diagnosis not present

## 2019-07-07 DIAGNOSIS — I129 Hypertensive chronic kidney disease with stage 1 through stage 4 chronic kidney disease, or unspecified chronic kidney disease: Secondary | ICD-10-CM | POA: Diagnosis not present

## 2019-07-07 DIAGNOSIS — R194 Change in bowel habit: Secondary | ICD-10-CM | POA: Diagnosis not present

## 2019-07-21 DIAGNOSIS — L932 Other local lupus erythematosus: Secondary | ICD-10-CM | POA: Diagnosis not present

## 2019-07-21 DIAGNOSIS — N179 Acute kidney failure, unspecified: Secondary | ICD-10-CM | POA: Diagnosis not present

## 2019-07-21 DIAGNOSIS — Z79899 Other long term (current) drug therapy: Secondary | ICD-10-CM | POA: Diagnosis not present

## 2019-07-21 DIAGNOSIS — I10 Essential (primary) hypertension: Secondary | ICD-10-CM | POA: Diagnosis not present

## 2019-11-11 ENCOUNTER — Telehealth: Payer: Self-pay

## 2019-11-11 NOTE — Telephone Encounter (Signed)
Michele Walton from [place] called and stated that the medication Modafinil 200mg  was approved and the approval is good from 11/11/19-11/10/20. Pt has been informed by BCBS.

## 2019-11-11 NOTE — Telephone Encounter (Signed)
PA done for Modafinil on cover my meds. Your information has been submitted to [place]. [employer] [place] will review the request and notify you of the determination decision directly, typically within 3 business days of your submission and once all necessary information is received. You will also receive your request decision electronically. To check for an update later, open the request again from your dashboard. If [employer] [place] has not responded within the specified timeframe or if you have any questions about your PA submission, contact [place]  directly at [facility]) [phone] or ([place]) [phone]

## 2019-11-30 ENCOUNTER — Telehealth (INDEPENDENT_AMBULATORY_CARE_PROVIDER_SITE_OTHER): Payer: Medicare Other | Admitting: Adult Health

## 2019-11-30 DIAGNOSIS — R400 Somnolence: Secondary | ICD-10-CM

## 2019-11-30 DIAGNOSIS — G35 Multiple sclerosis: Secondary | ICD-10-CM | POA: Diagnosis not present

## 2019-11-30 DIAGNOSIS — M4120 Other idiopathic scoliosis, site unspecified: Secondary | ICD-10-CM

## 2019-11-30 MED ORDER — MODAFINIL 200 MG PO TABS: 200.0000 mg | ORAL_TABLET | Freq: Every day | ORAL | 1 refills | Status: DC

## 2019-11-30 MED ORDER — NEURONTIN 300 MG PO CAPS: ORAL_CAPSULE | ORAL | 3 refills | Status: DC

## 2019-11-30 NOTE — Progress Notes (Signed)
PATIENT: Michele Walton DOB: 1946-05-15  REASON FOR VISIT: follow up HISTORY FROM: patient  Virtual Visit via Video Note  I connected with Michele Walton on 11/30/19 at  2:00 PM EST by a video enabled telemedicine application located remotely at [facility] and verified that I am speaking with the correct person using two identifiers who was located at their own home.   I discussed the limitations of evaluation and management by telemedicine and the availability of in person appointments. The patient expressed understanding and agreed to proceed.   PATIENT: Michele Walton DOB: Mar 09, 1946  REASON FOR VISIT: follow up HISTORY FROM: patient  HISTORY OF PRESENT ILLNESS: Today 11/30/19:  Michele Walton is a 74 year old female with a history of sjoegren's disease, scoliosis with sciatica, daytime sleepiness and multiple sclerosis.  She returns today for follow-up.  She is not on any disease modifying therapy. Denies any new symptoms.  No changes with the bowels or bladder.  No numbness or tingling.  Reports that her balance is better.  She denies any falls.  She does use a cane.  She uses gabapentin for nerve pain.  She states that the nerve pain is "all over."  She takes 300 mg in the morning and 600 mg at night.  She continues to take modafinil for daytime sleepiness.  Reports that it works well.  She returns today for an evaluation.  HISTORY 11/24/18: Michele Walton is a 74 year old female with a history of sjoegren's disease, scoliosis with sciatica, daytime sleepiness and multiple sclerosis.  She reports overall she has been doing well.  She reports that modafinil works well for her daytime sleepiness.  She likes the TEVA brand.  She continues on gabapentin for back pain and reports that she is not had any significant back pain.  She denies any signs of multiple sclerosis.  No change in her gait or balance.  No change in bowels or bladder.  No new numbness or weakness.  No  changes with her vision.  She returns today for evaluation.  She returns today for follow-up  REVIEW OF SYSTEMS: Out of a complete 14 system review of symptoms, the patient complains only of the following symptoms, and all other reviewed systems are negative.  See HPI  ALLERGIES: Allergies  Allergen Reactions  . Latex Rash    Peels her skin  . Percocet [Oxycodone-Acetaminophen] Nausea Only and Rash  . Sulfa Antibiotics Nausea Only and Rash  . Macrobid [Nitrofurantoin] Nausea Only  . Tape Rash    Tears skin    HOME MEDICATIONS: Outpatient Medications Prior to Visit  Medication Sig Dispense Refill  . aspirin EC 81 MG tablet Take 81 mg by mouth daily.    . benazepril-hydrochlorthiazide (LOTENSIN HCT) 20-25 MG per tablet Take 1 tablet by mouth every morning.    . calcium carbonate 200 MG capsule Take 200 mg by mouth daily.     . cholecalciferol (VITAMIN D) 1000 UNITS tablet Take 1,000 Units by mouth daily.    . Cyanocobalamin (VITAMIN B 12 PO) Take 1,000 mcg by mouth daily.     . fesoterodine (TOVIAZ) 4 MG TB24 Take 4 mg by mouth daily.    . hydroxychloroquine (PLAQUENIL) 200 MG tablet Take by mouth.    . modafinil (PROVIGIL) 200 MG tablet TAKE 1 TABLET BY MOUTH DAILY 90 tablet 1  . Multiple Vitamin (MULTI-VITAMINS) TABS Take by mouth.    . NEURONTIN 300 MG capsule Take 1 capsule (300 mg total) by mouth 3 (  three) times daily. 270 capsule 3  . polyethylene glycol (MIRALAX / GLYCOLAX) packet Take 17 g by mouth daily as needed. (Patient not taking: Reported on 11/24/2018) 14 each 0  . vitamin E 400 UNIT capsule Take 400 Units by mouth daily.     No facility-administered medications prior to visit.    PAST MEDICAL HISTORY: Past Medical History:  Diagnosis Date  . AC (acromioclavicular) joint bone spurs   . Arthritis   . 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   Marland Kitchen Bursitis    . Chronic pain   . Degenerative joint disease   . Goiter, nontoxic, multinodular   . High cholesterol   . Hypertension   . Lupus (HCC)    hx. subcutaneous lupus.  . Multiple sclerosis ([place])   . Osteoarthritis   . PONV (postoperative nausea and vomiting)   . Scoliosis   . Sjogren's disease ([place]) 09/2014  . Skin cancer   . Torn meniscus 1/14   left knee    PAST SURGICAL HISTORY: Past Surgical History:  Procedure Laterality Date  . childbirth  [id number]   admissions  . DILATION AND CURETTAGE OF UTERUS    . KNEE SURGERY Left 09-01-13   1'14  . SKIN CANCER EXCISION     right nasal bridge  . TONSILLECTOMY    . TOTAL HIP ARTHROPLASTY Left 09/08/2013   Procedure: LEFT TOTAL HIP ARTHROPLASTY ANTERIOR APPROACH;  Surgeon: Mauri Pole, MD;  Location: [facility];  Service: Orthopedics;  Laterality: Left;    FAMILY HISTORY: Family History  Problem Relation Age of Onset  . Heart disease Mother   . Cancer Other     SOCIAL HISTORY: Social History   Socioeconomic History  . Marital status: Single    Spouse name: Not on file  . Number of children: 2  . Years of education: Bachelor  . Highest education level: Not on file  Occupational History  . Occupation: plan administer    Employer: [employer]    Comment: [employer]  Tobacco Use  . Smoking status: Never Smoker  . Smokeless tobacco: Never Used  Substance and Sexual Activity  . Alcohol use: No  . Drug use: No  . Sexual activity: Yes  Other Topics Concern  . Not on file  Social History Narrative   Patient is single with 2 children.   Patient is right handed   Patient has a Bachelor's degree.   Patient drinks 1 cup daily.   Social Determinants of Health   Financial Resource Strain:   . Difficulty of Paying Living Expenses: Not on file  Food Insecurity:   . Worried About Charity fundraiser in the Last Year: Not on file  . Ran Out of Food in the Last Year: Not on file  Transportation Needs:   . Lack of  Transportation (Medical): Not on file  . Lack of Transportation (Non-Medical): Not on file  Physical Activity:   . Days of Exercise per Week: Not on file  . Minutes of Exercise per Session: Not on file  Stress:   . Feeling of Stress : Not on file  Social Connections:   . Frequency of Communication with Friends and Family: Not on file  . Frequency of Social Gatherings with Friends and Family: Not on file  . Attends Religious Services: Not on file  . Active Member of Clubs or Organizations: Not on  file  . Attends Archivist Meetings: Not on file  . Marital Status: Not on file  Intimate Partner Violence:   . Fear of Current or Ex-Partner: Not on file  . Emotionally Abused: Not on file  . Physically Abused: Not on file  . Sexually Abused: Not on file      PHYSICAL EXAM Generalized: Well developed, in no acute distress   Neurological examination  Mentation: Alert oriented to time, place, history taking. Follows all commands speech and language fluent Cranial nerve II-XII:Extraocular movements were full. Facial symmetry noted. Head turning and shoulder shrug  were normal and symmetric. Motor: Good strength throughout subjectively per patient Sensory: Sensory testing is intact to soft touch on all 4 extremities subjectively per patient Coordination: Cerebellar testing reveals good finger-nose-finger  Gait and station: Patient is able to stand from a seated position. gait is normal.  Reflexes: UTA  DIAGNOSTIC DATA (LABS, IMAGING, TESTING) - I reviewed patient records, labs, notes, testing and imaging myself where available.  Lab Results  Component Value Date   WBC 4.6 05/31/2015   HGB 10.8 (L) 05/31/2015   HCT 32.0 (L) 05/31/2015   MCV 91.7 05/31/2015   PLT 221 05/31/2015      Component Value Date/Time   NA 137 05/31/2015 1309   K 3.9 05/31/2015 1309   CL 98 08/11/2014 1425   CO2 29 05/31/2015 1309   GLUCOSE 85 05/31/2015 1309   BUN 26.5 (H) 05/31/2015 1309    CREATININE 1.5 (H) 05/31/2015 1309   CALCIUM 10.0 05/31/2015 1309   PROT 7.9 05/31/2015 1309   ALBUMIN 3.6 05/31/2015 1309   AST 30 05/31/2015 1309   ALT 15 05/31/2015 1309   ALKPHOS 98 05/31/2015 1309   BILITOT 0.34 05/31/2015 1309   GFRNONAA 48 (L) 08/11/2014 1425   GFRAA 55 (L) 08/11/2014 1425      ASSESSMENT AND PLAN 74 y.o. year old female  has a past medical history of AC (acromioclavicular) joint bone spurs, Arthritis, Autoimmune disease, not elsewhere classified(279.49) (08/04/2013), Bursitis, Chronic pain, Degenerative joint disease, Goiter, nontoxic, multinodular, High cholesterol, Hypertension, Lupus ([place]), Multiple sclerosis ([place]), Osteoarthritis, PONV (postoperative nausea and vomiting), Scoliosis, Sjogren's disease ([place]) (09/2014), Skin cancer, and Torn meniscus (1/14). here with :  1.  Multiple sclerosis 2.  Daytime sleepiness 3.  Scoliosis with sciatica  The patient is currently not on any disease modifying therapy.  She will continue on Neurontin 300 mg in the morning and 600 mg in the evening.  She will continue on Provigil 200 mg daily.  I have advised that if her symptoms worsen or she develops new symptoms she should let us know.  She will follow-up in 1 year or sooner if needed.   Ward Givens, MSN, NP-C 11/30/2019, 2:15 PM [facility] [address] [place], [place] [address] [phone]

## 2019-12-15 ENCOUNTER — Other Ambulatory Visit: Payer: Self-pay | Admitting: Adult Health

## 2019-12-15 NOTE — Telephone Encounter (Signed)
Patient called in and stated the pharmacy states they havent received the modafinil (PROVIGIL) 200 MG tablet and they sent over a authorization for the medication , she is wanting to know the status

## 2019-12-16 NOTE — Telephone Encounter (Signed)
[facility], spoke with Arnethia and advised a new Rx was sent in 11/30/19, and med was approved by [facility] until Dec 2021. She stated she talked to patient last night. She is requesting a certain brand- Teva. They have ordered it several times, but it hasn't come in. Arnethia stated the patient wants to wait until they can get that brand.  She  verbalized understanding, appreciation.

## 2020-03-07 ENCOUNTER — Other Ambulatory Visit: Payer: Self-pay | Admitting: Adult Health

## 2020-03-07 NOTE — Telephone Encounter (Signed)
1) Medication(s) Requested (by name): NEURONTIN 300 MG capsule   2) Pharmacy of Choice: [facility] [place], [place] - [place] AT [address] [place]  [place], [place] [address]   3) Special Requests:   pt is requesting the brand name of neurotin not generic

## 2020-03-08 NOTE — Telephone Encounter (Signed)
LVM informing her that NP refilled Neurontin to her pharmacy in Jan with additional refills x 1 year. Advised she should call pharmacy for refill on Neurontin. Left # for questions.

## 2020-03-09 ENCOUNTER — Telehealth: Payer: Self-pay | Admitting: *Deleted

## 2020-03-09 NOTE — Telephone Encounter (Signed)
Brand Neurontin PA key:  [id number] Dx :sjogren disease M35. On brand since 2015. Failed: tizanidine, gabapentin, percocet, methocarbimol.  Your information has been submitted to [place]. Blue Cross [place] will review the request and notify you of the determination decision directly, typically within 3 business days of your submission and once all necessary information is received.  You will also receive your request decision electronically. To check for an update later, open the request again from your dashboard.  If [employer] [place] has not responded within the specified timeframe or if you have any questions about your PA submission, contact [place] directly at [facility]) [phone] or ([place]) [phone].

## 2020-03-09 NOTE — Telephone Encounter (Signed)
BCBS Approved Neurontin brand name today. Effective from 03/09/2020 through 03/09/2021. Approval faxed to [place].

## 2020-06-03 ENCOUNTER — Other Ambulatory Visit: Payer: Self-pay | Admitting: Adult Health

## 2020-06-03 DIAGNOSIS — M4120 Other idiopathic scoliosis, site unspecified: Secondary | ICD-10-CM

## 2020-12-05 ENCOUNTER — Ambulatory Visit (INDEPENDENT_AMBULATORY_CARE_PROVIDER_SITE_OTHER): Payer: Medicare Other | Admitting: Adult Health

## 2020-12-05 ENCOUNTER — Other Ambulatory Visit: Payer: Self-pay

## 2020-12-05 ENCOUNTER — Encounter: Payer: Self-pay | Admitting: Adult Health

## 2020-12-05 VITALS — BP 113/61 | HR 81 | Ht 61.0 in | Wt 110.0 lb

## 2020-12-05 DIAGNOSIS — G35 Multiple sclerosis: Secondary | ICD-10-CM

## 2020-12-05 DIAGNOSIS — M4120 Other idiopathic scoliosis, site unspecified: Secondary | ICD-10-CM

## 2020-12-05 DIAGNOSIS — M3500 Sicca syndrome, unspecified: Secondary | ICD-10-CM | POA: Diagnosis not present

## 2020-12-05 DIAGNOSIS — R400 Somnolence: Secondary | ICD-10-CM

## 2020-12-05 MED ORDER — NEURONTIN 300 MG PO CAPS: ORAL_CAPSULE | ORAL | 3 refills | Status: DC

## 2020-12-05 MED ORDER — MODAFINIL 200 MG PO TABS: ORAL_TABLET | ORAL | 1 refills | Status: DC

## 2020-12-05 NOTE — Progress Notes (Signed)
PATIENT: Michele Walton DOB: [birth year]  REASON FOR VISIT: follow up HISTORY FROM: patient  HISTORY OF PRESENT ILLNESS: Today 12/05/20:  Michele Walton is a 75 year old female with a history of Sjogren's disease, scoliosis with sciatica, daytime sleepiness and multiple sclerosis.  She returns today for follow-up.  She continues on gabapentin 300 mg in the morning and 600 mg at night.  She reports that this is helpful for her nerve pain.  She denies any new symptoms related to multiple sclerosis.  No changes with her gait or balance.  She actually feels that her balance is better.  No changes with bowels or bladder.  She does state that she has cataracts.  Followed up with ophthalmology who stated that they were unable to remove her cataracts due to Sjogren's disease.  She plans to get a second opinion.  She denies any falls.  Continues to take Provigil 200 mg daily for daytime sleepiness.  Reports that she feels that the effects have changed since the medicine was switched to generic.  Although she still feels it is manageable. She returns today for an evaluation.  HISTORY 11/30/19:  Michele Walton is a 75 year old female with a history of sjoegren's disease, scoliosis with sciatica, daytime sleepiness and multiple sclerosis.  She returns today for follow-up.  She is not on any disease modifying therapy. Denies any new symptoms.  No changes with the bowels or bladder.  No numbness or tingling.  Reports that her balance is better.  She denies any falls.  She does use a cane.  She uses gabapentin for nerve pain.  She states that the nerve pain is "all over."  She takes 300 mg in the morning and 600 mg at night.  She continues to take modafinil for daytime sleepiness.  Reports that it works well.  She returns today for an evaluation.  REVIEW OF SYSTEMS: Out of a complete 14 system review of symptoms, the patient complains only of the following symptoms, and all other reviewed systems are negative.  See  HPI  ALLERGIES: Allergies  Allergen Reactions  . Latex Rash    Peels her skin  . Percocet [Oxycodone-Acetaminophen] Nausea Only and Rash  . Sulfa Antibiotics Nausea Only and Rash  . Macrobid [Nitrofurantoin] Nausea Only  . [place] Hives  . Tape Rash    Tears skin    HOME MEDICATIONS: Outpatient Medications Prior to Visit  Medication Sig Dispense Refill  . benazepril-hydrochlorthiazide (LOTENSIN HCT) 20-25 MG per tablet Take 1 tablet by mouth every morning.    . calcium carbonate 200 MG capsule Take 200 mg by mouth daily.     . cholecalciferol (VITAMIN D) 1000 UNITS tablet Take 1,000 Units by mouth daily.    . Cyanocobalamin (VITAMIN B 12 PO) Take 1,000 mcg by mouth daily.     . fesoterodine (TOVIAZ) 4 MG TB24 tablet Take 4 mg by mouth daily.    . hydroxychloroquine (PLAQUENIL) 200 MG tablet Take by mouth.    . modafinil (PROVIGIL) 200 MG tablet TAKE 1 TABLET(200 MG) BY MOUTH DAILY 90 tablet 1  . Multiple Vitamin (MULTI-VITAMINS) TABS Take by mouth.    . NEURONTIN 300 MG capsule Take 1 tablet in the morning and 2 tablets at bedtimes 270 capsule 3  . vitamin E 400 UNIT capsule Take 400 Units by mouth daily.    Marland Kitchen aspirin EC 81 MG tablet Take 81 mg by mouth daily.    . polyethylene glycol (MIRALAX / GLYCOLAX) packet Take  17 g by mouth daily as needed. (Patient not taking: Reported on 11/24/2018) 14 each 0   No facility-administered medications prior to visit.    PAST MEDICAL HISTORY: Past Medical History:  Diagnosis Date  . AC (acromioclavicular) joint bone spurs   . Arthritis   . 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   Marland Kitchen Bursitis   . Chronic pain   . Degenerative joint disease   . Goiter, nontoxic, multinodular   . High cholesterol   . Hypertension   . Lupus (HCC)    hx. subcutaneous lupus.  . Multiple sclerosis ([place])   . Osteoarthritis   . PONV  (postoperative nausea and vomiting)   . Scoliosis   . Sjogren's disease ([place]) 09/2014  . Skin cancer   . Torn meniscus 1/14   left knee    PAST SURGICAL HISTORY: Past Surgical History:  Procedure Laterality Date  . childbirth  0630,1601   admissions  . DILATION AND CURETTAGE OF UTERUS    . KNEE SURGERY Left 09-01-13   1'14  . SKIN CANCER EXCISION     right nasal bridge  . TONSILLECTOMY    . TOTAL HIP ARTHROPLASTY Left 09/08/2013   Procedure: LEFT TOTAL HIP ARTHROPLASTY ANTERIOR APPROACH;  Surgeon: Mauri Pole, MD;  Location: [facility];  Service: Orthopedics;  Laterality: Left;    FAMILY HISTORY: Family History  Problem Relation Age of Onset  . Heart disease Mother   . Cancer Other     SOCIAL HISTORY: Social History   Socioeconomic History  . Marital status: Single    Spouse name: Not on file  . Number of children: 2  . Years of education: Bachelor  . Highest education level: Not on file  Occupational History  . Occupation: plan administer    Employer: [employer]    Comment: [employer]  Tobacco Use  . Smoking status: Never Smoker  . Smokeless tobacco: Never Used  Substance and Sexual Activity  . Alcohol use: No  . Drug use: No  . Sexual activity: Yes  Other Topics Concern  . Not on file  Social History Narrative   Patient is single with 2 children.   Patient is right handed   Patient has a Bachelor's degree.   Patient drinks 1 cup daily.   Social Determinants of Health   Financial Resource Strain: Not on file  Food Insecurity: Not on file  Transportation Needs: Not on file  Physical Activity: Not on file  Stress: Not on file  Social Connections: Not on file  Intimate Partner Violence: Not on file      PHYSICAL EXAM  Vitals:   12/05/20 1305  BP: 113/61  Pulse: 81  Weight: 110 lb (49.9 kg)  Height: 5\' 1"  (1.549 m)   Body mass index is 20.78 kg/m.  Generalized: Well developed, in no acute distress   Neurological examination   Mentation: Alert oriented to time, place, history taking. Follows all commands speech and language fluent Cranial nerve II-XII: Pupils were equal round reactive to light. Extraocular movements were full, visual field were full on confrontational test. Facial sensation and strength were normal. Uvula tongue midline. Head turning and shoulder shrug  were normal and symmetric. Motor: The motor testing reveals 5 over 5 strength of all 4 extremities. Good symmetric motor tone is noted throughout.  Sensory: Sensory testing is intact to soft touch on all 4  extremities. No evidence of extinction is noted.  Coordination: Cerebellar testing reveals good finger-nose-finger and heel-to-shin bilaterally.  Gait and station: Patient has a stooped posture.  Tandem gait not attempted. Reflexes: Deep tendon reflexes are symmetric and normal bilaterally.   DIAGNOSTIC DATA (LABS, IMAGING, TESTING) - I reviewed patient records, labs, notes, testing and imaging myself where available.  Lab Results  Component Value Date   WBC 4.6 05/31/2015   HGB 10.8 (L) 05/31/2015   HCT 32.0 (L) 05/31/2015   MCV 91.7 05/31/2015   PLT 221 05/31/2015      Component Value Date/Time   NA 137 05/31/2015 1309   K 3.9 05/31/2015 1309   CL 98 08/11/2014 1425   CO2 29 05/31/2015 1309   GLUCOSE 85 05/31/2015 1309   BUN 26.5 (H) 05/31/2015 1309   CREATININE 1.5 (H) 05/31/2015 1309   CALCIUM 10.0 05/31/2015 1309   PROT 7.9 05/31/2015 1309   ALBUMIN 3.6 05/31/2015 1309   AST 30 05/31/2015 1309   ALT 15 05/31/2015 1309   ALKPHOS 98 05/31/2015 1309   BILITOT 0.34 05/31/2015 1309   GFRNONAA 48 (L) 08/11/2014 1425   GFRAA 55 (L) 08/11/2014 1425      ASSESSMENT AND PLAN 75 y.o. year old female  has a past medical history of AC (acromioclavicular) joint bone spurs, Arthritis, Autoimmune disease, not elsewhere classified(279.49) (08/04/2013), Bursitis, Chronic pain, Degenerative joint disease, Goiter, nontoxic, multinodular, High  cholesterol, Hypertension, Lupus ([place]), Multiple sclerosis ([place]), Osteoarthritis, PONV (postoperative nausea and vomiting), Scoliosis, Sjogren's disease ([place]) (09/2014), Skin cancer, and Torn meniscus (1/14). here with:  1.  Multiple sclerosis 2. Sjogren's disease 3.  Daytime sleepiness  -- Currently not on any disease modifying therapy -- Continue gabapentin 300 mg in the morning and 600 at bedtime -- Continue Provigil 200 mg daily -- Follow-up in 1 year or sooner if needed   I spent 25 minutes of face-to-face and non-face-to-face time with patient.  This included previsit chart review, lab review, study review, order entry, electronic health record documentation, patient education.  Ward Givens, MSN, NP-C 12/05/2020, 1:11 PM [facility] [address], [place], [place] [address] [phone]

## 2020-12-05 NOTE — Patient Instructions (Signed)
Your Plan:  Continue Provigil and gabapentin If your symptoms worsen or you develop new symptoms please let us know.    Thank you for coming to see [place] at [facility]. I hope we have been able to provide you high quality care today.  You may receive a patient satisfaction survey over the next few weeks. We would appreciate your feedback and comments so that we may continue to improve ourselves and the health of our patients.

## 2020-12-07 DIAGNOSIS — Z20822 Contact with and (suspected) exposure to covid-19: Secondary | ICD-10-CM | POA: Diagnosis not present

## 2020-12-19 ENCOUNTER — Encounter (HOSPITAL_BASED_OUTPATIENT_CLINIC_OR_DEPARTMENT_OTHER): Payer: Self-pay | Admitting: Emergency Medicine

## 2020-12-19 ENCOUNTER — Emergency Department (HOSPITAL_BASED_OUTPATIENT_CLINIC_OR_DEPARTMENT_OTHER)
Admission: EM | Admit: 2020-12-19 | Discharge: 2020-12-19 | Disposition: A | Discharge status: Home / Self Care | Payer: Medicare Other | Attending: Emergency Medicine | Admitting: Emergency Medicine

## 2020-12-19 ENCOUNTER — Emergency Department (HOSPITAL_BASED_OUTPATIENT_CLINIC_OR_DEPARTMENT_OTHER): Payer: Medicare Other

## 2020-12-19 ENCOUNTER — Other Ambulatory Visit: Payer: Self-pay

## 2020-12-19 DIAGNOSIS — I10 Essential (primary) hypertension: Secondary | ICD-10-CM | POA: Insufficient documentation

## 2020-12-19 DIAGNOSIS — R944 Abnormal results of kidney function studies: Secondary | ICD-10-CM | POA: Diagnosis not present

## 2020-12-19 DIAGNOSIS — Z85828 Personal history of other malignant neoplasm of skin: Secondary | ICD-10-CM | POA: Insufficient documentation

## 2020-12-19 DIAGNOSIS — N3000 Acute cystitis without hematuria: Secondary | ICD-10-CM | POA: Insufficient documentation

## 2020-12-19 DIAGNOSIS — Z91040 Latex allergy status: Secondary | ICD-10-CM | POA: Diagnosis not present

## 2020-12-19 DIAGNOSIS — U071 COVID-19: Secondary | ICD-10-CM | POA: Insufficient documentation

## 2020-12-19 DIAGNOSIS — E860 Dehydration: Secondary | ICD-10-CM | POA: Insufficient documentation

## 2020-12-19 DIAGNOSIS — Z96642 Presence of left artificial hip joint: Secondary | ICD-10-CM | POA: Insufficient documentation

## 2020-12-19 DIAGNOSIS — R7989 Other specified abnormal findings of blood chemistry: Secondary | ICD-10-CM

## 2020-12-19 DIAGNOSIS — R531 Weakness: Secondary | ICD-10-CM | POA: Diagnosis not present

## 2020-12-19 DIAGNOSIS — Z79899 Other long term (current) drug therapy: Secondary | ICD-10-CM | POA: Diagnosis not present

## 2020-12-19 DIAGNOSIS — R9431 Abnormal electrocardiogram [ECG] [EKG]: Secondary | ICD-10-CM | POA: Diagnosis not present

## 2020-12-19 LAB — CBC WITH DIFFERENTIAL/PLATELET
Abs Immature Granulocytes: 0.02 10*3/uL (ref 0.00–0.07)
Basophils Absolute: 0 10*3/uL (ref 0.0–0.1)
Basophils Relative: 0 %
Eosinophils Absolute: 0 10*3/uL (ref 0.0–0.5)
Eosinophils Relative: 0 %
HCT: 30.5 % — ABNORMAL LOW (ref 36.0–46.0)
Hemoglobin: 10.5 g/dL — ABNORMAL LOW (ref 12.0–15.0)
Immature Granulocytes: 0 %
Lymphocytes Relative: 12 %
Lymphs Abs: 0.6 10*3/uL — ABNORMAL LOW (ref 0.7–4.0)
MCH: 32.2 pg (ref 26.0–34.0)
MCHC: 34.4 g/dL (ref 30.0–36.0)
MCV: 93.6 fL (ref 80.0–100.0)
Monocytes Absolute: 0.4 10*3/uL (ref 0.1–1.0)
Monocytes Relative: 7 %
Neutro Abs: 3.8 10*3/uL (ref 1.7–7.7)
Neutrophils Relative %: 81 %
Platelets: 188 10*3/uL (ref 150–400)
RBC: 3.26 MIL/uL — ABNORMAL LOW (ref 3.87–5.11)
RDW: 12.1 % (ref 11.5–15.5)
WBC: 4.8 10*3/uL (ref 4.0–10.5)
nRBC: 0 % (ref 0.0–0.2)

## 2020-12-19 LAB — BASIC METABOLIC PANEL
Anion gap: 10 (ref 5–15)
Anion gap: 8 (ref 5–15)
BUN: 43 mg/dL — ABNORMAL HIGH (ref 8–23)
BUN: 42 mg/dL — ABNORMAL HIGH (ref 8–23)
CO2: 23 mmol/L (ref 22–32)
CO2: 20 mmol/L — ABNORMAL LOW (ref 22–32)
Calcium: 8.9 mg/dL (ref 8.9–10.3)
Calcium: 7.9 mg/dL — ABNORMAL LOW (ref 8.9–10.3)
Chloride: 99 mmol/L (ref 98–111)
Chloride: 107 mmol/L (ref 98–111)
Creatinine, Ser: 2.22 mg/dL — ABNORMAL HIGH (ref 0.44–1.00)
Creatinine, Ser: 1.94 mg/dL — ABNORMAL HIGH (ref 0.44–1.00)
GFR, Estimated: 23 mL/min — ABNORMAL LOW (ref >60)
GFR, Estimated: 27 mL/min — ABNORMAL LOW (ref >60)
Glucose, Bld: 99 mg/dL (ref 70–99)
Glucose, Bld: 75 mg/dL (ref 70–99)
Potassium: 3.1 mmol/L — ABNORMAL LOW (ref 3.5–5.1)
Potassium: 3.6 mmol/L (ref 3.5–5.1)
Sodium: 132 mmol/L — ABNORMAL LOW (ref 135–145)
Sodium: 135 mmol/L (ref 135–145)

## 2020-12-19 LAB — URINALYSIS, MICROSCOPIC (REFLEX)

## 2020-12-19 LAB — URINALYSIS, ROUTINE W REFLEX MICROSCOPIC
Bilirubin Urine: NEGATIVE
Glucose, UA: NEGATIVE mg/dL
Ketones, ur: NEGATIVE mg/dL
Nitrite: POSITIVE — AB
Protein, ur: 100 mg/dL — AB
Specific Gravity, Urine: 1.025 (ref 1.005–1.030)
pH: 6.5 (ref 5.0–8.0)

## 2020-12-19 LAB — MAGNESIUM: Magnesium: 2 mg/dL (ref 1.7–2.4)

## 2020-12-19 MED ORDER — CEPHALEXIN 500 MG PO CAPS: 500.0000 mg | ORAL_CAPSULE | Freq: Three times a day (TID) | ORAL | 0 refills | Status: DC

## 2020-12-19 MED ORDER — CEPHALEXIN 250 MG PO CAPS
500.0000 mg | ORAL_CAPSULE | Freq: Once | ORAL | Status: AC
  Administered 2020-12-19: 500 mg via ORAL
  Filled 2020-12-19: qty 2

## 2020-12-19 MED ORDER — POTASSIUM CHLORIDE CRYS ER 20 MEQ PO TBCR
40.0000 meq | EXTENDED_RELEASE_TABLET | Freq: Once | ORAL | Status: AC
  Administered 2020-12-19: 40 meq via ORAL
  Filled 2020-12-19: qty 2

## 2020-12-19 MED ORDER — POTASSIUM CHLORIDE CRYS ER 20 MEQ PO TBCR: 20.0000 meq | EXTENDED_RELEASE_TABLET | Freq: Every day | ORAL | 0 refills | Status: DC

## 2020-12-19 MED ORDER — ONDANSETRON 4 MG PO TBDP: 4.0000 mg | ORAL_TABLET | Freq: Three times a day (TID) | ORAL | 0 refills | Status: DC | PRN

## 2020-12-19 MED ORDER — SODIUM CHLORIDE 0.9 % IV BOLUS
1000.0000 mL | Freq: Once | INTRAVENOUS | Status: DC
Start: 2020-12-19 — End: 2020-12-19

## 2020-12-19 MED ORDER — SODIUM CHLORIDE 0.9 % IV BOLUS
1000.0000 mL | Freq: Once | INTRAVENOUS | Status: AC
  Administered 2020-12-19: 1000 mL via INTRAVENOUS

## 2020-12-19 MED ORDER — ONDANSETRON HCL 4 MG/2ML IJ SOLN
4.0000 mg | Freq: Once | INTRAMUSCULAR | Status: AC
  Administered 2020-12-19: 4 mg via INTRAVENOUS
  Filled 2020-12-19: qty 2

## 2020-12-19 NOTE — Discharge Instructions (Addendum)
You were seen in the ER today for low blood pressure.  Your blood pressure improved with fluids in the ER today.  Your lab work showed that your creatinine which measures your kidney function was elevated, this improved with fluids but is still not normal.  Please continue to drink plenty of fluids with electrolytes such as Gatorade or Pedialyte. or pedialyte.   Please do not take your blood pressure medications (benazepril/hydrochlorathiazie) for the next 1 week until able to follow up with your primary care provider.   We are sending you home with the following medications:  - Zofran- take every 8 hours as needed for nausea/vomiting - Keflex- take three times per day to treat your urinary tract infection.  - Potassium- take daily for the next 3 days to help with your potassium which was initially low.   We have prescribed you new medication(s) today. Discuss the medications prescribed today with your pharmacist as they can have adverse effects and interactions with your other medicines including over the counter and prescribed medications. Seek medical evaluation if you start to experience new or abnormal symptoms after taking one of these medicines, seek care immediately if you start to experience difficulty breathing, feeling of your throat closing, facial swelling, or rash as these could be indications of a more serious allergic reaction   Please follow up with your primary care provider within 3 days. Call to make an appointment. Please have your blood work rechecked as well.  Return to the ER for new or worsening symptoms including but not limited to inability to keep fluids down, fever, abdominal pain,back/flank pain, passing out, return of low blood pressure, chest pain, trouble breathing, coughing up blood, or any other concerns that you may have.

## 2020-12-19 NOTE — ED Notes (Signed)
Charge nurse aware of low bp and triage nurse

## 2020-12-19 NOTE — ED Notes (Signed)
Pt on monitor and vitals cycling 

## 2020-12-19 NOTE — ED Provider Notes (Signed)
[facility] EMERGENCY DEPARTMENT Provider Note   CSN: [id number] Arrival date & time: 12/19/20  1407     History Chief Complaint  Patient presents with  . Hypotension    Covid +    [name] is a 75 y.o. female with a hx of sjorgren's disease, hypertension, hypercholesterolemia, lupus, and multiple sclerosis who presents to the ED with complaints of generally not feeling well over the past 1 week. She states she has had poor appetite, nausea, poor PO intake, and has felt generally weak/fatigued. Did have a headache on day 1 of sxs and a syncopal episode on day 2 of sxs, but has not had recurrent headache/syncope. Has tested positive for COVID with her sxs. Had had minimal cough. She noted her BP was low at home.  Denies fever, chest pain, dyspnea, vomiting, diarrhea, abdominal pain, or dysuria.   HPI     Past Medical History:  Diagnosis Date  . AC (acromioclavicular) joint bone spurs   . Arthritis   . 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   Marland Kitchen Bursitis   . Chronic pain   . Degenerative joint disease   . Goiter, nontoxic, multinodular   . High cholesterol   . Hypertension   . Lupus (HCC)    hx. subcutaneous lupus.  . Multiple sclerosis ([place])   . Osteoarthritis   . PONV (postoperative nausea and vomiting)   . Scoliosis   . Sjogren's disease ([place]) 09/2014  . Skin cancer   . Torn meniscus 1/14   left knee    Patient Active Problem List   Diagnosis Date Noted  . Sciatica 11/14/2016  . Sjoegren syndrome 02/09/2015  . Scoliosis (and kyphoscoliosis), idiopathic 02/09/2015  . Neural foraminal stenosis of cervical spine 02/09/2015  . Spinal stenosis of cervical region 02/09/2015  . Scoliosis 12/30/2013  . Lumbar canal stenosis 12/30/2013  . Expected blood loss anemia 09/09/2013  . S/P left THA, AA 09/08/2013  . Autoimmune disease, not elsewhere  classified(279.49) 08/04/2013  . Multiple sclerosis ([place])   . Face lesion 08/19/2012    Past Surgical History:  Procedure Laterality Date  . childbirth  [id number]   admissions  . DILATION AND CURETTAGE OF UTERUS    . KNEE SURGERY Left 09-01-13   1'14  . SKIN CANCER EXCISION     right nasal bridge  . TONSILLECTOMY    . TOTAL HIP ARTHROPLASTY Left 09/08/2013   Procedure: LEFT TOTAL HIP ARTHROPLASTY ANTERIOR APPROACH;  Surgeon: Mauri Pole, MD;  Location: [facility];  Service: Orthopedics;  Laterality: Left;     OB History   No obstetric history on file.     Family History  Problem Relation Age of Onset  . Heart disease Mother   . Cancer Other     Social History   Tobacco Use  . Smoking status: Never Smoker  . Smokeless tobacco: Never Used  Substance Use Topics  . Alcohol use: No  . Drug use: No    Home Medications Prior to Admission medications   Medication Sig Start Date End Date Taking? Authorizing Provider  benazepril-hydrochlorthiazide (LOTENSIN HCT) 20-25 MG per tablet Take 1 tablet by mouth every morning.    [provider]  calcium carbonate 200 MG capsule Take 200 mg by mouth daily.     [provider]  cholecalciferol (VITAMIN D) 1000 UNITS tablet Take 1,000 Units by mouth daily.  [provider]  Cyanocobalamin (VITAMIN B 12 PO) Take 1,000 mcg by mouth daily.     [provider]  fesoterodine (TOVIAZ) 4 MG TB24 tablet Take 4 mg by mouth daily.    [provider]  hydroxychloroquine (PLAQUENIL) 200 MG tablet Take by mouth.    [provider]  modafinil (PROVIGIL) 200 MG tablet TAKE 1 TABLET(200 MG) BY MOUTH DAILY 12/05/20   Ward Givens, NP  Multiple Vitamin (MULTI-VITAMINS) TABS Take by mouth.    [provider]  NEURONTIN 300 MG capsule Take 1 tablet in the morning and 2 tablets at bedtimes 12/05/20   Ward Givens, NP  vitamin E 400 UNIT capsule Take 400 Units by mouth daily.    [provider]    Allergies    Latex, Percocet [oxycodone-acetaminophen], Sulfa antibiotics, Macrobid [nitrofurantoin], Red dye, and Tape  Review of Systems   Review of Systems  Constitutional: Positive for appetite change and fatigue. Negative for chills and fever.  HENT: Negative for congestion.   Respiratory: Positive for cough. Negative for shortness of breath.   Cardiovascular: Negative for chest pain and leg swelling.  Gastrointestinal: Positive for nausea. Negative for abdominal pain, blood in stool, constipation, diarrhea and vomiting.  Genitourinary: Negative for dysuria.  Neurological: Positive for syncope, weakness (generalized) and headaches (resolved).  All other systems reviewed and are negative.   Physical Exam Updated Vital Signs BP (!) 85/52 (BP Location: Right Arm)   Pulse 93   Temp 99.4 F (37.4 C) (Oral)   Resp (!) 22   Ht 5\' 1"  (1.549 m)   Wt 49.9 kg   SpO2 99%   BMI 20.78 kg/m   Physical Exam Vitals and nursing note reviewed.  Constitutional:      General: She is not in acute distress.    Appearance: She is well-developed. She is not toxic-appearing.  HENT:     Head: Normocephalic and atraumatic.     Mouth/Throat:     Mouth: Mucous membranes are dry.  Eyes:     General:        Right eye: No discharge.        Left eye: No discharge.     Conjunctiva/sclera: Conjunctivae normal.  Cardiovascular:     Rate and Rhythm: Normal rate and regular rhythm.  Pulmonary:     Effort: Pulmonary effort is normal. No respiratory distress.     Breath sounds: Normal breath sounds. No wheezing, rhonchi or rales.  Abdominal:     General: There is no distension.     Palpations: Abdomen is soft.     Tenderness: There is no abdominal tenderness. There is no guarding or rebound.  Musculoskeletal:     Cervical back: Neck supple.  Skin:    General: Skin is warm and dry.     Findings: No rash.  Neurological:     General: No focal deficit present.     Mental  Status: She is alert.     Comments: Clear speech.   Psychiatric:        Behavior: Behavior normal.     ED Results / Procedures / Treatments   Labs (all labs ordered are listed, but only abnormal results are displayed) Labs Reviewed  BASIC METABOLIC PANEL - Abnormal; Notable for the following components:      Result Value   Sodium 132 (*)    Potassium 3.1 (*)    BUN 43 (*)    Creatinine, Ser 2.22 (*)    GFR, Estimated  23 (*)    All other components within normal limits  CBC WITH DIFFERENTIAL/PLATELET - Abnormal; Notable for the following components:   RBC 3.26 (*)    Hemoglobin 10.5 (*)    HCT 30.5 (*)    Lymphs Abs 0.6 (*)    All other components within normal limits    EKG None  Radiology DG Chest Portable 1 View  Result Date: 12/19/2020 CLINICAL DATA:  COVID-19 positive, weakness, lethargy EXAM: PORTABLE CHEST 1 VIEW COMPARISON:  09/01/2013 FINDINGS: Single frontal view of the chest demonstrates an unremarkable cardiac silhouette. No acute airspace disease, effusion, or pneumothorax. Stable calcified granuloma left midlung. No acute bony abnormalities. IMPRESSION: 1. No acute intrathoracic process. Electronically Signed   By: Randa Ngo M.D.   On: 12/19/2020 15:22    Procedures Procedures   Medications Ordered in ED Medications - No data to display  ED Course  I have reviewed the triage vital signs and the nursing notes.  Pertinent labs & imaging results that were available during my care of the patient were reviewed by me and considered in my medical decision making (see chart for details).  Michele Walton was evaluated in Emergency Department on 12/19/2020 for the symptoms described in the history of present illness. He/she was evaluated in the context of the global COVID-19 pandemic, which necessitated consideration that the patient might be at risk for infection with the SARS-CoV-2 virus that causes COVID-19. Institutional protocols and algorithms that pertain  to the evaluation of patients at risk for COVID-19 are in a state of rapid change based on information released by regulatory bodies including the CDC and federal and state organizations. These policies and algorithms were followed during the patient's care in the ED.   MDM Rules/Calculators/A&P                         Patient presents to the ED with complaints of fatigue, generalized weakness, and poor PO intake. Patient is hypotensive.  Mucous membranes are dry.  Otherwise benign physical exam.    Additional history obtained:  Additional history obtained from chart review & nursing note review.   Lab Tests:  I Ordered, reviewed, and interpreted labs, which included:  CBC: Anemia similar to prior BMP: Mild hypokalemia at 3.1.  Elevated BUN and creatinine which is increased from prior however last labs on record from 5 years ago. Magnesium: Within normal limits Urinalysis: Findings consistent w/ UTI.   EKG: No STEMI or significant arrhthymia.   Imaging Studies ordered:  CXR ordered by triage, I independently visualized and interpreted imaging which showed no acute process.   ED Course:  Patient with mild AKI, dry MM, suspect hypotension secondary to dehydration.  2L NS given as well as oral potassium following zofran.   Repeat BMP with improving creatinine, potassium normalized. Patient feeling much better, tolerating PO, feels ready to go home.   Vitals:   12/19/20 2002 12/19/20 2030 12/19/20 2100 12/19/20 2130  BP: 118/65 (!) 119/58 120/61 128/61  Pulse: 68 69 66 71  Temp: 98.9 F (37.2 C)     Resp: 16 14 10 13   Height:      Weight:      SpO2: 99% 100% 100% 100%  TempSrc: Oral     BMI (Calculated):       Will discharge home with encouragement for plenty of fluids including electrolyte containing solutions, hold BP medications, and start keflex for UTI. Very close PCP follow up for recheck  of renal function advised. Ambulatory referral to covid treatment clinic for possible  further treatment qualification. I discussed results, treatment plan, need for follow-up, and return precautions with the patient. Provided opportunity for questions, patient confirmed understanding and is in agreement with plan.   This is a shared visit with supervising physician Dr. little who has independently evaluated patient & provided guidance in evaluation/management/disposition, in agreement with care   Portions of this note were generated with Dragon dictation software. Dictation errors may occur despite best attempts at proofreading.   Final Clinical Impression(s) / ED Diagnoses Final diagnoses:  Dehydration  Elevated serum creatinine  Acute cystitis without hematuria    Rx / DC Orders ED Discharge Orders         Ordered    cephALEXin (KEFLEX) 500 MG capsule  3 times daily        12/19/20 2210    ondansetron (ZOFRAN ODT) 4 MG disintegrating tablet  Every 8 hours PRN        12/19/20 2210    potassium chloride SA (KLOR-CON) 20 MEQ tablet  Daily        12/19/20 2210    Ambulatory referral for Covid Treatment        12/19/20 2210           Amaryllis Dyke, PA-C 12/19/20 2212    Little, Wenda [place], MD 12/21/20 1505

## 2020-12-19 NOTE — ED Triage Notes (Signed)
Pt states she is positive for covid.  Pt has lethargy and malaise.  Decreased appetite.  Pt states her BP at home is low.

## 2020-12-20 ENCOUNTER — Telehealth: Payer: Self-pay | Admitting: Nurse Practitioner

## 2020-12-20 NOTE — Telephone Encounter (Signed)
Called to Discuss with patient about Covid symptoms and the use of the monoclonal antibody infusion/antiviral/ or oral treatment for those with mild to moderate Covid symptoms and at a high risk of hospitalization.     Pt DOES NOT qualify for this infusion due to reported symptom onset of 12/13/20 (> 7days). Discussed at length with patient criteria that she does meet based on her co-morbid conditions and/or a member of an at-risk group in accordance with the FDA Emergency Use Authorization however given symptoms onset and today (12/20/20) being day 8.   She verbalized understanding although disappointed in not being able to receive treatment.    Support provided along with education regarding quarantine and when to seek medical treatment.   Alda Lea, NP [facility]  [phone]

## 2020-12-22 DIAGNOSIS — U071 COVID-19: Secondary | ICD-10-CM | POA: Diagnosis not present

## 2020-12-22 DIAGNOSIS — N179 Acute kidney failure, unspecified: Secondary | ICD-10-CM | POA: Diagnosis not present

## 2020-12-22 LAB — URINE CULTURE: Culture: >=100000 — AB

## 2020-12-23 NOTE — Progress Notes (Signed)
ED Antimicrobial Stewardship Positive Culture Follow Up   Michele Walton is an 75 y.o. female who presented to [facility] on 12/19/2020 with a chief complaint of viral symptoms x 1 week. Diagnosed with COVID-19.  Chief Complaint  Patient presents with  . Hypotension    Covid +    Recent Results (from the past 720 hour(s))  Urine culture     Status: Abnormal   Collection Time: 12/19/20  7:39 PM   Specimen: Urine, Random  Result Value Ref Range Status   Specimen Description   Final    URINE, RANDOM Performed at [facility], [address] [place], [place] [address]    Special Requests   Final    NONE Performed at [facility], [address] [place], [place] [address]    Culture >=100,000 COLONIES/mL PSEUDOMONAS AERUGINOSA (A)  Final   Report Status 12/22/2020 FINAL  Final   Organism ID, Bacteria PSEUDOMONAS AERUGINOSA (A)  Final      Susceptibility   Pseudomonas aeruginosa - MIC*    CEFTAZIDIME <=1 SENSITIVE Sensitive     CIPROFLOXACIN <=0.25 SENSITIVE Sensitive     GENTAMICIN <=1 SENSITIVE Sensitive     IMIPENEM <=0.25 SENSITIVE Sensitive     PIP/TAZO <=4 SENSITIVE Sensitive     CEFEPIME 2 SENSITIVE Sensitive     * >=100,000 COLONIES/mL PSEUDOMONAS AERUGINOSA    Patient was discharged on Keflex but urinary organism is resistant. Symptoms could have been caused by COVID-19. Patient placement to call patient for symptom check and instruct her to stop Keflex. If symptoms have resolved, no further antibiotic therapy needed. If symptoms persist, the following antibiotic will be called in  New antibiotic prescription: Ciprofloxacin 500 mg twice daily x 5 days   ED Provider: Deno Etienne, MD    [name], PharmD., BCPS, BCCCP Clinical Pharmacist Please refer to [facility] for unit-specific pharmacist

## 2021-01-05 DIAGNOSIS — L930 Discoid lupus erythematosus: Secondary | ICD-10-CM | POA: Diagnosis not present

## 2021-01-05 DIAGNOSIS — Z79899 Other long term (current) drug therapy: Secondary | ICD-10-CM | POA: Diagnosis not present

## 2021-01-05 DIAGNOSIS — L309 Dermatitis, unspecified: Secondary | ICD-10-CM | POA: Diagnosis not present

## 2021-01-05 DIAGNOSIS — U071 COVID-19: Secondary | ICD-10-CM | POA: Diagnosis not present

## 2021-01-05 DIAGNOSIS — I10 Essential (primary) hypertension: Secondary | ICD-10-CM | POA: Diagnosis not present

## 2021-01-18 DIAGNOSIS — Z01419 Encounter for gynecological examination (general) (routine) without abnormal findings: Secondary | ICD-10-CM | POA: Diagnosis not present

## 2021-01-18 DIAGNOSIS — Z1231 Encounter for screening mammogram for malignant neoplasm of breast: Secondary | ICD-10-CM | POA: Diagnosis not present

## 2021-02-03 DIAGNOSIS — N3281 Overactive bladder: Secondary | ICD-10-CM | POA: Diagnosis not present

## 2021-02-03 DIAGNOSIS — N3941 Urge incontinence: Secondary | ICD-10-CM | POA: Diagnosis not present

## 2021-03-14 DIAGNOSIS — H2513 Age-related nuclear cataract, bilateral: Secondary | ICD-10-CM | POA: Diagnosis not present

## 2021-03-17 DIAGNOSIS — R350 Frequency of micturition: Secondary | ICD-10-CM | POA: Diagnosis not present

## 2021-03-17 DIAGNOSIS — N3281 Overactive bladder: Secondary | ICD-10-CM | POA: Diagnosis not present

## 2021-03-20 ENCOUNTER — Other Ambulatory Visit: Payer: Self-pay | Admitting: Adult Health

## 2021-03-20 DIAGNOSIS — M4120 Other idiopathic scoliosis, site unspecified: Secondary | ICD-10-CM

## 2021-03-21 ENCOUNTER — Telehealth: Payer: Self-pay | Admitting: *Deleted

## 2021-03-21 NOTE — Telephone Encounter (Signed)
Modafinil PA, key: [id number]. Your information has been submitted to [place]. [employer] [place] will review the request and notify you of the determination decision directly, typically within 3 business days of your submission and once all necessary information is received. You will also receive your request decision electronically.. If [employer] [place] has not responded within the specified timeframe or if you have any questions about your PA submission, contact [place] directly at [facility]) [phone] or ([place]) [phone].

## 2021-03-21 NOTE — Telephone Encounter (Signed)
Modafinil approved  from 03/21/2021 through 03/21/2022.

## 2021-03-22 ENCOUNTER — Other Ambulatory Visit: Payer: Medicare Other

## 2021-03-22 ENCOUNTER — Other Ambulatory Visit: Payer: Self-pay | Admitting: Family Medicine

## 2021-03-22 DIAGNOSIS — I10 Essential (primary) hypertension: Secondary | ICD-10-CM | POA: Diagnosis not present

## 2021-03-22 DIAGNOSIS — E440 Moderate protein-calorie malnutrition: Secondary | ICD-10-CM | POA: Diagnosis not present

## 2021-03-22 DIAGNOSIS — R634 Abnormal weight loss: Secondary | ICD-10-CM

## 2021-03-22 DIAGNOSIS — E7800 Pure hypercholesterolemia, unspecified: Secondary | ICD-10-CM | POA: Diagnosis not present

## 2021-03-22 DIAGNOSIS — M8580 Other specified disorders of bone density and structure, unspecified site: Secondary | ICD-10-CM | POA: Diagnosis not present

## 2021-03-22 DIAGNOSIS — Z0000 Encounter for general adult medical examination without abnormal findings: Secondary | ICD-10-CM | POA: Diagnosis not present

## 2021-03-22 DIAGNOSIS — D638 Anemia in other chronic diseases classified elsewhere: Secondary | ICD-10-CM | POA: Diagnosis not present

## 2021-03-22 DIAGNOSIS — N1832 Chronic kidney disease, stage 3b: Secondary | ICD-10-CM | POA: Diagnosis not present

## 2021-03-23 ENCOUNTER — Other Ambulatory Visit: Payer: Medicare Other

## 2021-03-29 DIAGNOSIS — I10 Essential (primary) hypertension: Secondary | ICD-10-CM | POA: Diagnosis not present

## 2021-03-30 ENCOUNTER — Other Ambulatory Visit: Payer: Self-pay | Admitting: Family Medicine

## 2021-03-30 DIAGNOSIS — M8580 Other specified disorders of bone density and structure, unspecified site: Secondary | ICD-10-CM

## 2021-04-03 ENCOUNTER — Ambulatory Visit
Admission: RE | Admit: 2021-04-03 | Discharge: 2021-04-03 | Disposition: A | Discharge status: Home / Self Care | Payer: Medicare Other | Source: Ambulatory Visit | Attending: Family Medicine | Admitting: Family Medicine

## 2021-04-03 DIAGNOSIS — M4186 Other forms of scoliosis, lumbar region: Secondary | ICD-10-CM | POA: Diagnosis not present

## 2021-04-03 DIAGNOSIS — J984 Other disorders of lung: Secondary | ICD-10-CM | POA: Diagnosis not present

## 2021-04-03 DIAGNOSIS — N200 Calculus of kidney: Secondary | ICD-10-CM | POA: Diagnosis not present

## 2021-04-03 DIAGNOSIS — M47816 Spondylosis without myelopathy or radiculopathy, lumbar region: Secondary | ICD-10-CM | POA: Diagnosis not present

## 2021-04-03 DIAGNOSIS — R634 Abnormal weight loss: Secondary | ICD-10-CM

## 2021-04-03 DIAGNOSIS — J439 Emphysema, unspecified: Secondary | ICD-10-CM | POA: Diagnosis not present

## 2021-04-03 MED ORDER — IOPAMIDOL (ISOVUE-300) INJECTION 61%
60.0000 mL | Freq: Once | INTRAVENOUS | Status: AC | PRN
  Administered 2021-04-03: 60 mL via INTRAVENOUS

## 2021-04-12 DIAGNOSIS — N3281 Overactive bladder: Secondary | ICD-10-CM | POA: Diagnosis not present

## 2021-04-12 DIAGNOSIS — I10 Essential (primary) hypertension: Secondary | ICD-10-CM | POA: Diagnosis not present

## 2021-04-12 DIAGNOSIS — N3941 Urge incontinence: Secondary | ICD-10-CM | POA: Diagnosis not present

## 2021-04-20 DIAGNOSIS — Z79899 Other long term (current) drug therapy: Secondary | ICD-10-CM | POA: Diagnosis not present

## 2021-04-20 DIAGNOSIS — H2513 Age-related nuclear cataract, bilateral: Secondary | ICD-10-CM | POA: Diagnosis not present

## 2021-05-11 DIAGNOSIS — H04123 Dry eye syndrome of bilateral lacrimal glands: Secondary | ICD-10-CM | POA: Diagnosis not present

## 2021-06-14 DIAGNOSIS — L821 Other seborrheic keratosis: Secondary | ICD-10-CM | POA: Diagnosis not present

## 2021-06-14 DIAGNOSIS — D225 Melanocytic nevi of trunk: Secondary | ICD-10-CM | POA: Diagnosis not present

## 2021-06-14 DIAGNOSIS — L570 Actinic keratosis: Secondary | ICD-10-CM | POA: Diagnosis not present

## 2021-06-14 DIAGNOSIS — L814 Other melanin hyperpigmentation: Secondary | ICD-10-CM | POA: Diagnosis not present

## 2021-06-14 DIAGNOSIS — T148XXA Other injury of unspecified body region, initial encounter: Secondary | ICD-10-CM | POA: Diagnosis not present

## 2021-06-14 DIAGNOSIS — L930 Discoid lupus erythematosus: Secondary | ICD-10-CM | POA: Diagnosis not present

## 2021-06-14 DIAGNOSIS — Z79899 Other long term (current) drug therapy: Secondary | ICD-10-CM | POA: Diagnosis not present

## 2021-06-22 DIAGNOSIS — H04123 Dry eye syndrome of bilateral lacrimal glands: Secondary | ICD-10-CM | POA: Diagnosis not present

## 2021-08-07 DIAGNOSIS — N189 Chronic kidney disease, unspecified: Secondary | ICD-10-CM | POA: Diagnosis not present

## 2021-08-07 DIAGNOSIS — Z79899 Other long term (current) drug therapy: Secondary | ICD-10-CM | POA: Diagnosis not present

## 2021-08-07 DIAGNOSIS — N179 Acute kidney failure, unspecified: Secondary | ICD-10-CM | POA: Diagnosis not present

## 2021-08-07 DIAGNOSIS — L932 Other local lupus erythematosus: Secondary | ICD-10-CM | POA: Diagnosis not present

## 2021-08-07 DIAGNOSIS — I10 Essential (primary) hypertension: Secondary | ICD-10-CM | POA: Diagnosis not present

## 2021-08-14 DIAGNOSIS — H2513 Age-related nuclear cataract, bilateral: Secondary | ICD-10-CM | POA: Diagnosis not present

## 2021-08-14 DIAGNOSIS — H04123 Dry eye syndrome of bilateral lacrimal glands: Secondary | ICD-10-CM | POA: Diagnosis not present

## 2021-10-25 ENCOUNTER — Other Ambulatory Visit: Payer: Medicare Other

## 2021-12-06 ENCOUNTER — Ambulatory Visit: Payer: Medicare Other | Admitting: Adult Health

## 2021-12-19 DIAGNOSIS — L930 Discoid lupus erythematosus: Secondary | ICD-10-CM | POA: Diagnosis not present

## 2021-12-19 DIAGNOSIS — Z79899 Other long term (current) drug therapy: Secondary | ICD-10-CM | POA: Diagnosis not present

## 2021-12-26 DIAGNOSIS — E7800 Pure hypercholesterolemia, unspecified: Secondary | ICD-10-CM | POA: Diagnosis not present

## 2021-12-26 DIAGNOSIS — I10 Essential (primary) hypertension: Secondary | ICD-10-CM | POA: Diagnosis not present

## 2021-12-26 DIAGNOSIS — M8580 Other specified disorders of bone density and structure, unspecified site: Secondary | ICD-10-CM | POA: Diagnosis not present

## 2021-12-26 DIAGNOSIS — G35 Multiple sclerosis: Secondary | ICD-10-CM | POA: Diagnosis not present

## 2021-12-26 DIAGNOSIS — D631 Anemia in chronic kidney disease: Secondary | ICD-10-CM | POA: Diagnosis not present

## 2021-12-26 DIAGNOSIS — N1832 Chronic kidney disease, stage 3b: Secondary | ICD-10-CM | POA: Diagnosis not present

## 2022-01-01 DIAGNOSIS — H04123 Dry eye syndrome of bilateral lacrimal glands: Secondary | ICD-10-CM | POA: Diagnosis not present

## 2022-01-01 DIAGNOSIS — H2513 Age-related nuclear cataract, bilateral: Secondary | ICD-10-CM | POA: Diagnosis not present

## 2022-01-01 DIAGNOSIS — Z79899 Other long term (current) drug therapy: Secondary | ICD-10-CM | POA: Diagnosis not present

## 2022-01-01 DIAGNOSIS — M329 Systemic lupus erythematosus, unspecified: Secondary | ICD-10-CM | POA: Diagnosis not present

## 2022-01-06 ENCOUNTER — Other Ambulatory Visit: Payer: Self-pay | Admitting: Adult Health

## 2022-01-29 ENCOUNTER — Other Ambulatory Visit: Payer: Self-pay | Admitting: Adult Health

## 2022-01-29 DIAGNOSIS — M4120 Other idiopathic scoliosis, site unspecified: Secondary | ICD-10-CM

## 2022-01-30 NOTE — Telephone Encounter (Signed)
Last visit: 12-05-20 ?Next visit 03-14-22 ?Per [place] registry, last filled #90/90 on 08-14-21 ?

## 2022-02-07 DIAGNOSIS — Z1231 Encounter for screening mammogram for malignant neoplasm of breast: Secondary | ICD-10-CM | POA: Diagnosis not present

## 2022-02-07 DIAGNOSIS — Z01419 Encounter for gynecological examination (general) (routine) without abnormal findings: Secondary | ICD-10-CM | POA: Diagnosis not present

## 2022-02-26 DIAGNOSIS — N3281 Overactive bladder: Secondary | ICD-10-CM | POA: Diagnosis not present

## 2022-02-26 DIAGNOSIS — N3941 Urge incontinence: Secondary | ICD-10-CM | POA: Diagnosis not present

## 2022-03-14 ENCOUNTER — Telehealth: Payer: Self-pay | Admitting: Adult Health

## 2022-03-14 ENCOUNTER — Ambulatory Visit: Payer: Medicare Other | Admitting: Adult Health

## 2022-03-14 NOTE — Telephone Encounter (Signed)
Contacted pt to reschedule appt for today due to provider being out. Reschedule appt on 05/14/22 at 10:30 am ?

## 2022-03-26 ENCOUNTER — Ambulatory Visit
Admission: RE | Admit: 2022-03-26 | Discharge: 2022-03-26 | Disposition: A | Discharge status: Home / Self Care | Payer: Medicare Other | Source: Ambulatory Visit | Attending: Family Medicine | Admitting: Family Medicine

## 2022-03-26 DIAGNOSIS — M8580 Other specified disorders of bone density and structure, unspecified site: Secondary | ICD-10-CM

## 2022-03-26 DIAGNOSIS — Z780 Asymptomatic menopausal state: Secondary | ICD-10-CM | POA: Diagnosis not present

## 2022-03-26 DIAGNOSIS — M810 Age-related osteoporosis without current pathological fracture: Secondary | ICD-10-CM | POA: Diagnosis not present

## 2022-03-26 DIAGNOSIS — M85832 Other specified disorders of bone density and structure, left forearm: Secondary | ICD-10-CM | POA: Diagnosis not present

## 2022-04-12 ENCOUNTER — Telehealth: Payer: Self-pay | Admitting: Adult Health

## 2022-04-12 NOTE — Telephone Encounter (Signed)
[employer] Elmyra Ricks) PA for NEURONTIN 300 MG capsule approved effective date 5/18/245/18/2024

## 2022-05-01 DIAGNOSIS — I10 Essential (primary) hypertension: Secondary | ICD-10-CM | POA: Diagnosis not present

## 2022-05-01 DIAGNOSIS — E042 Nontoxic multinodular goiter: Secondary | ICD-10-CM | POA: Diagnosis not present

## 2022-05-01 DIAGNOSIS — L932 Other local lupus erythematosus: Secondary | ICD-10-CM | POA: Diagnosis not present

## 2022-05-01 DIAGNOSIS — M329 Systemic lupus erythematosus, unspecified: Secondary | ICD-10-CM | POA: Diagnosis not present

## 2022-05-01 DIAGNOSIS — E041 Nontoxic single thyroid nodule: Secondary | ICD-10-CM | POA: Diagnosis not present

## 2022-05-01 DIAGNOSIS — E7800 Pure hypercholesterolemia, unspecified: Secondary | ICD-10-CM | POA: Diagnosis not present

## 2022-05-03 DIAGNOSIS — M810 Age-related osteoporosis without current pathological fracture: Secondary | ICD-10-CM | POA: Diagnosis not present

## 2022-05-03 DIAGNOSIS — E7800 Pure hypercholesterolemia, unspecified: Secondary | ICD-10-CM | POA: Diagnosis not present

## 2022-05-03 DIAGNOSIS — Z0000 Encounter for general adult medical examination without abnormal findings: Secondary | ICD-10-CM | POA: Diagnosis not present

## 2022-05-03 DIAGNOSIS — E440 Moderate protein-calorie malnutrition: Secondary | ICD-10-CM | POA: Diagnosis not present

## 2022-05-03 DIAGNOSIS — D638 Anemia in other chronic diseases classified elsewhere: Secondary | ICD-10-CM | POA: Diagnosis not present

## 2022-05-03 DIAGNOSIS — N1832 Chronic kidney disease, stage 3b: Secondary | ICD-10-CM | POA: Diagnosis not present

## 2022-05-03 DIAGNOSIS — I10 Essential (primary) hypertension: Secondary | ICD-10-CM | POA: Diagnosis not present

## 2022-05-14 ENCOUNTER — Encounter: Payer: Self-pay | Admitting: Adult Health

## 2022-05-14 ENCOUNTER — Ambulatory Visit (INDEPENDENT_AMBULATORY_CARE_PROVIDER_SITE_OTHER): Payer: Medicare Other | Admitting: Adult Health

## 2022-05-14 VITALS — BP 130/76 | HR 68 | Ht 61.0 in | Wt 112.8 lb

## 2022-05-14 DIAGNOSIS — M4120 Other idiopathic scoliosis, site unspecified: Secondary | ICD-10-CM

## 2022-05-14 DIAGNOSIS — G35 Multiple sclerosis: Secondary | ICD-10-CM

## 2022-05-14 DIAGNOSIS — R400 Somnolence: Secondary | ICD-10-CM | POA: Diagnosis not present

## 2022-05-14 MED ORDER — MODAFINIL 200 MG PO TABS: ORAL_TABLET | ORAL | 0 refills | Status: DC

## 2022-05-14 NOTE — Patient Instructions (Signed)
Your Plan:  Continue gabapentin and Provigil If your symptoms worsen or you develop new symptoms please let us know.    Thank you for coming to see [place] at [facility]. I hope we have been able to provide you high quality care today.  You may receive a patient satisfaction survey over the next few weeks. We would appreciate your feedback and comments so that we may continue to improve ourselves and the health of our patients.

## 2022-05-14 NOTE — Progress Notes (Signed)
PATIENT: Michele Walton DOB: 1946/02/03  REASON FOR VISIT: follow up HISTORY FROM: patient  Chief Complaint  Patient presents with   Follow-up    Room 17 - alone. Yearly follow up. No new concerns with MS. Not treated with DMT. Recently diagnosed with osteoporosis after having a bone density scan.      HISTORY OF PRESENT ILLNESS: Today 05/14/22:  Michele Walton is a 76 year old female with a history of Sjogren's disease, scoliosis with sciatica, daytime sleepiness and multiple sclerosis.  She returns today for follow-up.  The patient is not on any disease modifying therapy.  She does take gabapentin 300 mg in the morning and 600 mg at night for nerve pain.  She denies any new symptoms.  Continues to take Provigil 200 mg daily for daytime sleepiness.  She returns today for an evaluation.  12/05/20: Michele Walton is a 76 year old female with a history of Sjogren's disease, scoliosis with sciatica, daytime sleepiness and multiple sclerosis.  She returns today for follow-up.  She continues on gabapentin 300 mg in the morning and 600 mg at night.  She reports that this is helpful for her nerve pain.  She denies any new symptoms related to multiple sclerosis.  No changes with her gait or balance.  She actually feels that her balance is better.  No changes with bowels or bladder.  She does state that she has cataracts.  Followed up with ophthalmology who stated that they were unable to remove her cataracts due to Sjogren's disease.  She plans to get a second opinion.  She denies any falls.  Continues to take Provigil 200 mg daily for daytime sleepiness.  Reports that she feels that the effects have changed since the medicine was switched to generic.  Although she still feels it is manageable. She returns today for an evaluation.  HISTORY 11/30/19:   Michele Walton is a 76 year old female with a history of sjoegren's disease, scoliosis with sciatica, daytime sleepiness and multiple sclerosis.  She  returns today for follow-up.  She is not on any disease modifying therapy. Denies any new symptoms.  No changes with the bowels or bladder.  No numbness or tingling.  Reports that her balance is better.  She denies any falls.  She does use a cane.  She uses gabapentin for nerve pain.  She states that the nerve pain is "all over."  She takes 300 mg in the morning and 600 mg at night.  She continues to take modafinil for daytime sleepiness.  Reports that it works well.  She returns today for an evaluation.  REVIEW OF SYSTEMS: Out of a complete 14 system review of symptoms, the patient complains only of the following symptoms, and all other reviewed systems are negative.  See HPI  ALLERGIES: Allergies  Allergen Reactions   Latex Rash    Peels her skin   Percocet [Oxycodone-Acetaminophen] Nausea Only and Rash   Sulfa Antibiotics Nausea Only and Rash   Macrobid [Nitrofurantoin] Nausea Only   Red Dye Hives   Tape Rash    Tears skin    HOME MEDICATIONS: Outpatient Medications Prior to Visit  Medication Sig Dispense Refill   benazepril-hydrochlorthiazide (LOTENSIN HCT) 20-25 MG per tablet Take 1 tablet by mouth every morning.     calcium carbonate 200 MG capsule Take 200 mg by mouth daily.      cephALEXin (KEFLEX) 500 MG capsule Take 1 capsule (500 mg total) by mouth 3 (three) times daily. 21  capsule 0   cholecalciferol (VITAMIN D) 1000 UNITS tablet Take 1,000 Units by mouth daily.     Cyanocobalamin (VITAMIN B 12 PO) Take 1,000 mcg by mouth daily.      fesoterodine (TOVIAZ) 4 MG TB24 tablet Take 4 mg by mouth daily.     hydroxychloroquine (PLAQUENIL) 200 MG tablet Take by mouth.     modafinil (PROVIGIL) 200 MG tablet TAKE 1 TABLET BY MOUTH EVERY DAY 60 tablet 0   Multiple Vitamin (MULTI-VITAMINS) TABS Take by mouth.     NEURONTIN 300 MG capsule TAKE 1 CAPSULE BY MOUTH EVERY MORNING AND 2 CAPSULES BY MOUTH EVERY NIGHT AT BEDTIME 270 capsule 3   ondansetron (ZOFRAN ODT) 4 MG disintegrating  tablet Take 1 tablet (4 mg total) by mouth every 8 (eight) hours as needed for nausea or vomiting. 5 tablet 0   potassium chloride SA (KLOR-CON) 20 MEQ tablet Take 1 tablet (20 mEq total) by mouth daily. 3 tablet 0   vitamin E 400 UNIT capsule Take 400 Units by mouth daily.     No facility-administered medications prior to visit.    PAST MEDICAL HISTORY: Past Medical History:  Diagnosis Date   AC (acromioclavicular) joint bone spurs    Arthritis    Autoimmune disease, not elsewhere classified(279.49) 08/04/2013   Lupus cutaneus,  With SLE type serology - and oligoclonal bands, negative brain MRI .    Bursitis    Chronic pain    Degenerative joint disease    Goiter, nontoxic, multinodular    High cholesterol    Hypertension    Lupus (HCC)    hx. subcutaneous lupus.   Multiple sclerosis (HCC)    Osteoarthritis    PONV (postoperative nausea and vomiting)    Scoliosis    Sjogren's disease ([place]) 09/2014   Skin cancer    Torn meniscus 1/14   left knee    PAST SURGICAL HISTORY: Past Surgical History:  Procedure Laterality Date   childbirth  1540,0867   admissions   DILATION AND CURETTAGE OF UTERUS     KNEE SURGERY Left 09-01-13   1'14   SKIN CANCER EXCISION     right nasal bridge   TONSILLECTOMY     TOTAL HIP ARTHROPLASTY Left 09/08/2013   Procedure: LEFT TOTAL HIP ARTHROPLASTY ANTERIOR APPROACH;  Surgeon: Mauri Pole, MD;  Location: [facility];  Service: Orthopedics;  Laterality: Left;    FAMILY HISTORY: Family History  Problem Relation Age of Onset   Heart disease Mother    Cancer Other     SOCIAL HISTORY: Social History   Socioeconomic History   Marital status: Married    Spouse name: Not on file   Number of children: 2   Years of education: Bachelor   Highest education level: Not on file  Occupational History   Occupation: plan administer    Employer: [employer]    Comment: [employer]  Tobacco Use   Smoking status: Never   Smokeless tobacco: Never   Substance and Sexual Activity   Alcohol use: No   Drug use: No   Sexual activity: Yes  Other Topics Concern   Not on file  Social History Narrative   Patient is single with 2 children.   Patient is right handed   Patient has a Bachelor's degree.   Patient drinks 1 cup daily.   Social Determinants of Health   Financial Resource Strain: Not on file  Food Insecurity: Not on file  Transportation Needs: Not on file  Physical Activity: Not on file  Stress: Not on file  Social Connections: Not on file  Intimate Partner Violence: Not on file      PHYSICAL EXAM  There were no vitals filed for this visit.  There is no height or weight on file to calculate BMI.  Generalized: Well developed, in no acute distress   Neurological examination  Mentation: Alert oriented to time, place, history taking. Follows all commands speech and language fluent Cranial nerve II-XII: Pupils were equal round reactive to light. Extraocular movements were full, visual field were full on confrontational test. Facial sensation and strength were normal. Uvula tongue midline. Head turning and shoulder shrug  were normal and symmetric. Motor: The motor testing reveals 5 over 5 strength of all 4 extremities. Good symmetric motor tone is noted throughout.  Sensory: Sensory testing is intact to soft touch on all 4 extremities. No evidence of extinction is noted.  Coordination: Cerebellar testing reveals good finger-nose-finger and heel-to-shin bilaterally.  Gait and station: Patient has a stooped posture.  Tandem gait not attempted. Reflexes: Deep tendon reflexes are symmetric and normal bilaterally.   DIAGNOSTIC DATA (LABS, IMAGING, TESTING) - I reviewed patient records, labs, notes, testing and imaging myself where available.  Lab Results  Component Value Date   WBC 4.8 12/19/2020   HGB 10.5 (L) 12/19/2020   HCT 30.5 (L) 12/19/2020   MCV 93.6 12/19/2020   PLT 188 12/19/2020      Component Value  Date/Time   NA 135 12/19/2020 2049   NA 137 05/31/2015 1309   K 3.6 12/19/2020 2049   K 3.9 05/31/2015 1309   CL 107 12/19/2020 2049   CO2 20 (L) 12/19/2020 2049   CO2 29 05/31/2015 1309   GLUCOSE 75 12/19/2020 2049   GLUCOSE 85 05/31/2015 1309   BUN 42 (H) 12/19/2020 2049   BUN 26.5 (H) 05/31/2015 1309   CREATININE 1.94 (H) 12/19/2020 2049   CREATININE 1.5 (H) 05/31/2015 1309   CALCIUM 7.9 (L) 12/19/2020 2049   CALCIUM 10.0 05/31/2015 1309   PROT 7.9 05/31/2015 1309   ALBUMIN 3.6 05/31/2015 1309   AST 30 05/31/2015 1309   ALT 15 05/31/2015 1309   ALKPHOS 98 05/31/2015 1309   BILITOT 0.34 05/31/2015 1309   GFRNONAA 27 (L) 12/19/2020 2049   GFRAA 55 (L) 08/11/2014 1425      ASSESSMENT AND PLAN 76 y.o. year old female  has a past medical history of AC (acromioclavicular) joint bone spurs, Arthritis, Autoimmune disease, not elsewhere classified(279.49) (08/04/2013), Bursitis, Chronic pain, Degenerative joint disease, Goiter, nontoxic, multinodular, High cholesterol, Hypertension, Lupus ([place]), Multiple sclerosis ([place]), Osteoarthritis, PONV (postoperative nausea and vomiting), Scoliosis, Sjogren's disease ([place]) (09/2014), Skin cancer, and Torn meniscus (1/14). here with:  1.  Multiple sclerosis 2. Sjogren's disease 3.  Daytime sleepiness  -- Currently not on any disease modifying therapy -- Continue gabapentin 300 mg in the morning and 600 at bedtime -- Continue Provigil 200 mg daily -- Follow-up in 1 year or sooner if needed    Ward Givens, MSN, NP-C 05/14/2022, 10:27 AM [facility] [address] [place], [place] [address] [phone]

## 2022-05-15 ENCOUNTER — Telehealth: Payer: Self-pay | Admitting: *Deleted

## 2022-05-15 NOTE — Telephone Encounter (Signed)
Modafinil 200 mg approved from 05/15/2022 through 05/16/2023. Approval faxed to [facility].

## 2022-05-15 NOTE — Telephone Encounter (Signed)
Created new  Key: [id number]. Your information has been submitted to [place]. [employer] [place] will review the request and notify you of the determination decision directly, typically within 3 business days of your submission and once all necessary information is received. If [employer] has not responded within the specified timeframe or if you have any questions about your PA submission, contact [employer] directly at [facility]) [phone] or ([place]) [phone].

## 2022-05-15 NOTE — Telephone Encounter (Signed)
Modafinil 200 mg PA, called [employer], spoke with JCW who transferred me to [facility] phone [phone], opt 3, opt 3. Spoke with Gae Bon, initiated PA. She gave Key [id number], on CMM. Checked key and it was for PA done last year that expired in April 2023.

## 2022-06-07 DIAGNOSIS — H2513 Age-related nuclear cataract, bilateral: Secondary | ICD-10-CM | POA: Diagnosis not present

## 2022-06-07 DIAGNOSIS — H04123 Dry eye syndrome of bilateral lacrimal glands: Secondary | ICD-10-CM | POA: Diagnosis not present

## 2022-06-27 DIAGNOSIS — Z79899 Other long term (current) drug therapy: Secondary | ICD-10-CM | POA: Diagnosis not present

## 2022-06-27 DIAGNOSIS — L814 Other melanin hyperpigmentation: Secondary | ICD-10-CM | POA: Diagnosis not present

## 2022-06-27 DIAGNOSIS — L930 Discoid lupus erythematosus: Secondary | ICD-10-CM | POA: Diagnosis not present

## 2022-06-27 DIAGNOSIS — L821 Other seborrheic keratosis: Secondary | ICD-10-CM | POA: Diagnosis not present

## 2022-06-27 DIAGNOSIS — K130 Diseases of lips: Secondary | ICD-10-CM | POA: Diagnosis not present

## 2022-10-14 ENCOUNTER — Other Ambulatory Visit: Payer: Self-pay | Admitting: Adult Health

## 2022-11-12 ENCOUNTER — Other Ambulatory Visit (HOSPITAL_COMMUNITY): Payer: Self-pay | Admitting: Family Medicine

## 2022-11-12 DIAGNOSIS — R29898 Other symptoms and signs involving the musculoskeletal system: Secondary | ICD-10-CM

## 2022-11-12 DIAGNOSIS — R413 Other amnesia: Secondary | ICD-10-CM | POA: Diagnosis not present

## 2022-11-12 DIAGNOSIS — M5412 Radiculopathy, cervical region: Secondary | ICD-10-CM

## 2022-11-12 DIAGNOSIS — R5383 Other fatigue: Secondary | ICD-10-CM | POA: Diagnosis not present

## 2022-11-14 ENCOUNTER — Ambulatory Visit (HOSPITAL_COMMUNITY)
Admission: RE | Admit: 2022-11-14 | Discharge: 2022-11-14 | Disposition: A | Discharge status: Home / Self Care | Payer: Medicare Other | Source: Ambulatory Visit | Attending: Family Medicine | Admitting: Family Medicine

## 2022-11-14 DIAGNOSIS — M5412 Radiculopathy, cervical region: Secondary | ICD-10-CM | POA: Diagnosis not present

## 2022-11-14 DIAGNOSIS — R531 Weakness: Secondary | ICD-10-CM | POA: Diagnosis not present

## 2022-11-14 DIAGNOSIS — M47812 Spondylosis without myelopathy or radiculopathy, cervical region: Secondary | ICD-10-CM | POA: Diagnosis not present

## 2022-11-14 DIAGNOSIS — M4319 Spondylolisthesis, multiple sites in spine: Secondary | ICD-10-CM | POA: Diagnosis not present

## 2022-11-14 DIAGNOSIS — M4802 Spinal stenosis, cervical region: Secondary | ICD-10-CM | POA: Diagnosis not present

## 2022-11-14 DIAGNOSIS — R29898 Other symptoms and signs involving the musculoskeletal system: Secondary | ICD-10-CM | POA: Insufficient documentation

## 2022-12-07 ENCOUNTER — Other Ambulatory Visit: Payer: Self-pay | Admitting: Adult Health

## 2022-12-07 DIAGNOSIS — M4120 Other idiopathic scoliosis, site unspecified: Secondary | ICD-10-CM

## 2022-12-11 ENCOUNTER — Telehealth: Payer: Self-pay | Admitting: Adult Health

## 2022-12-11 ENCOUNTER — Other Ambulatory Visit: Payer: Self-pay | Admitting: Adult Health

## 2022-12-11 DIAGNOSIS — M4120 Other idiopathic scoliosis, site unspecified: Secondary | ICD-10-CM

## 2022-12-11 NOTE — Telephone Encounter (Signed)
Pt last seen 04-2022 and next appt annual 04-2023.

## 2022-12-11 NOTE — Telephone Encounter (Signed)
Pt is calling. Stated she is going out of town tomorrow and she needs her refill of  Modafinil called in today.

## 2022-12-13 NOTE — Telephone Encounter (Signed)
Can you look into this and see why she is requested another refill

## 2022-12-26 DIAGNOSIS — M4802 Spinal stenosis, cervical region: Secondary | ICD-10-CM | POA: Diagnosis not present

## 2022-12-31 DIAGNOSIS — Z79899 Other long term (current) drug therapy: Secondary | ICD-10-CM | POA: Diagnosis not present

## 2022-12-31 DIAGNOSIS — L930 Discoid lupus erythematosus: Secondary | ICD-10-CM | POA: Diagnosis not present

## 2023-01-03 DIAGNOSIS — H04123 Dry eye syndrome of bilateral lacrimal glands: Secondary | ICD-10-CM | POA: Diagnosis not present

## 2023-01-03 DIAGNOSIS — Z79899 Other long term (current) drug therapy: Secondary | ICD-10-CM | POA: Diagnosis not present

## 2023-02-12 DIAGNOSIS — Z1231 Encounter for screening mammogram for malignant neoplasm of breast: Secondary | ICD-10-CM | POA: Diagnosis not present

## 2023-03-18 DIAGNOSIS — H04123 Dry eye syndrome of bilateral lacrimal glands: Secondary | ICD-10-CM | POA: Diagnosis not present

## 2023-03-18 DIAGNOSIS — Z79899 Other long term (current) drug therapy: Secondary | ICD-10-CM | POA: Diagnosis not present

## 2023-03-18 DIAGNOSIS — H2513 Age-related nuclear cataract, bilateral: Secondary | ICD-10-CM | POA: Diagnosis not present

## 2023-04-04 ENCOUNTER — Other Ambulatory Visit: Payer: Self-pay | Admitting: Adult Health

## 2023-04-04 DIAGNOSIS — M4120 Other idiopathic scoliosis, site unspecified: Secondary | ICD-10-CM

## 2023-04-08 NOTE — Telephone Encounter (Signed)
Fax confirmation received for modafinil to pharmacy [phone].

## 2023-05-06 DIAGNOSIS — E7800 Pure hypercholesterolemia, unspecified: Secondary | ICD-10-CM | POA: Diagnosis not present

## 2023-05-06 DIAGNOSIS — Z79899 Other long term (current) drug therapy: Secondary | ICD-10-CM | POA: Diagnosis not present

## 2023-05-08 DIAGNOSIS — E440 Moderate protein-calorie malnutrition: Secondary | ICD-10-CM | POA: Diagnosis not present

## 2023-05-08 DIAGNOSIS — N3946 Mixed incontinence: Secondary | ICD-10-CM | POA: Diagnosis not present

## 2023-05-08 DIAGNOSIS — Z79899 Other long term (current) drug therapy: Secondary | ICD-10-CM | POA: Diagnosis not present

## 2023-05-08 DIAGNOSIS — D631 Anemia in chronic kidney disease: Secondary | ICD-10-CM | POA: Diagnosis not present

## 2023-05-08 DIAGNOSIS — I700 Atherosclerosis of aorta: Secondary | ICD-10-CM | POA: Diagnosis not present

## 2023-05-08 DIAGNOSIS — I10 Essential (primary) hypertension: Secondary | ICD-10-CM | POA: Diagnosis not present

## 2023-05-08 DIAGNOSIS — Z0000 Encounter for general adult medical examination without abnormal findings: Secondary | ICD-10-CM | POA: Diagnosis not present

## 2023-05-08 DIAGNOSIS — M810 Age-related osteoporosis without current pathological fracture: Secondary | ICD-10-CM | POA: Diagnosis not present

## 2023-05-08 DIAGNOSIS — E7800 Pure hypercholesterolemia, unspecified: Secondary | ICD-10-CM | POA: Diagnosis not present

## 2023-05-08 DIAGNOSIS — N1832 Chronic kidney disease, stage 3b: Secondary | ICD-10-CM | POA: Diagnosis not present

## 2023-05-10 ENCOUNTER — Telehealth: Payer: Self-pay

## 2023-05-10 ENCOUNTER — Other Ambulatory Visit (HOSPITAL_COMMUNITY): Payer: Self-pay

## 2023-05-10 NOTE — Telephone Encounter (Signed)
Pharmacy Patient Advocate Encounter   Received notification from [facility] that prior authorization for Modafinil 200MG  Tablets is required/requested.   PA submitted to [facility] via Fax [phone]) Key or (Medicaid) confirmation # N/A Status is pending

## 2023-05-15 ENCOUNTER — Ambulatory Visit (INDEPENDENT_AMBULATORY_CARE_PROVIDER_SITE_OTHER): Payer: Medicare Other | Admitting: Adult Health

## 2023-05-15 ENCOUNTER — Ambulatory Visit: Payer: Medicare Other | Admitting: Adult Health

## 2023-05-15 ENCOUNTER — Encounter: Payer: Self-pay | Admitting: Adult Health

## 2023-05-15 VITALS — BP 125/75 | HR 82 | Ht 59.0 in | Wt 114.8 lb

## 2023-05-15 DIAGNOSIS — M4120 Other idiopathic scoliosis, site unspecified: Secondary | ICD-10-CM

## 2023-05-15 DIAGNOSIS — R400 Somnolence: Secondary | ICD-10-CM | POA: Diagnosis not present

## 2023-05-15 DIAGNOSIS — G35 Multiple sclerosis: Secondary | ICD-10-CM

## 2023-05-15 NOTE — Progress Notes (Addendum)
PATIENT: Michele Walton DOB: 1946/02/06  REASON FOR VISIT: follow up HISTORY FROM: patient  Chief Complaint  Patient presents with   Follow-up    Rm 19. Alone. States she is doing very well. No new or worsening sx.     HISTORY OF PRESENT ILLNESS: Today 05/15/23:  Michele Walton is a 77 y.o. female with a history of Sojourn's disease, scoliosis with sciatica, daytime sleepiness and multiple sclerosis. Returns today for follow-up.  Overall she reports that she has been doing well.  No new or worsening symptoms.  She remains on gabapentin 300 mg in the morning and 600 mg at night for nerve pain- primarily in the legs.   States that Provigil continues to work well for her.  She returns today for an evaluation.   05/14/22: Michele Walton is a 77 year old female with a history of Sjogren's disease, scoliosis with sciatica, daytime sleepiness and multiple sclerosis.  She returns today for follow-up.  The patient is not on any disease modifying therapy.  She does take gabapentin 300 mg in the morning and 600 mg at night for nerve pain.  She denies any new symptoms.  Continues to take Provigil 200 mg daily for daytime sleepiness.  She returns today for an evaluation.  12/05/20: Michele Walton is a 77 year old female with a history of Sjogren's disease, scoliosis with sciatica, daytime sleepiness and multiple sclerosis.  She returns today for follow-up.  She continues on gabapentin 300 mg in the morning and 600 mg at night.  She reports that this is helpful for her nerve pain.  She denies any new symptoms related to multiple sclerosis.  No changes with her gait or balance.  She actually feels that her balance is better.  No changes with bowels or bladder.  She does state that she has cataracts.  Followed up with ophthalmology who stated that they were unable to remove her cataracts due to Sjogren's disease.  She plans to get a second opinion.  She denies any falls.  Continues to take Provigil 200 mg  daily for daytime sleepiness.  Reports that she feels that the effects have changed since the medicine was switched to generic.  Although she still feels it is manageable. She returns today for an evaluation.  HISTORY 11/30/19:   Michele Walton is a 77 year old female with a history of sjoegren's disease, scoliosis with sciatica, daytime sleepiness and multiple sclerosis.  She returns today for follow-up.  She is not on any disease modifying therapy. Denies any new symptoms.  No changes with the bowels or bladder.  No numbness or tingling.  Reports that her balance is better.  She denies any falls.  She does use a cane.  She uses gabapentin for nerve pain.  She states that the nerve pain is "all over."  She takes 300 mg in the morning and 600 mg at night.  She continues to take modafinil for daytime sleepiness.  Reports that it works well.  She returns today for an evaluation.  REVIEW OF SYSTEMS: Out of a complete 14 system review of symptoms, the patient complains only of the following symptoms, and all other reviewed systems are negative.  See HPI  ALLERGIES: Allergies  Allergen Reactions   Latex Rash    Peels her skin   Percocet [Oxycodone-Acetaminophen] Nausea Only and Rash   Sulfa Antibiotics Nausea Only and Rash   Crestor [Rosuvastatin] Other (See Comments)    Leg weakness and cramping.  Lipitor [Atorvastatin] Other (See Comments)    Leg weakness and cramping.    Macrobid [Nitrofurantoin] Nausea Only   Red Dye Hives   Xiidra [Lifitegrast] Other (See Comments)    Caused "eyes to bleed".   Tape Rash    Tears skin    HOME MEDICATIONS: Outpatient Medications Prior to Visit  Medication Sig Dispense Refill   CALCIUM PO Take 600 mg by mouth daily.     cholecalciferol (VITAMIN D) 1000 UNITS tablet Take 1,000 Units by mouth daily.     fesoterodine (TOVIAZ) 8 MG TB24 tablet Take 8 mg by mouth daily.     hydroxychloroquine (PLAQUENIL) 200 MG tablet Take by mouth.     loratadine  (CLARITIN) 10 MG tablet Take 10 mg by mouth daily.     modafinil (PROVIGIL) 200 MG tablet TAKE 1 TABLET BY MOUTH EVERY DAY 30 tablet 1   Multiple Vitamin (MULTI-VITAMINS) TABS Take by mouth.     NEURONTIN 300 MG capsule TAKE 1 CAPSULE BY MOUTH EVERY MORNING AND 2 CAPSULES EVERY NIGHT AT BEDTIME 270 capsule 1   Cyanocobalamin (VITAMIN B 12 PO) Take 1,000 mcg by mouth daily.      No facility-administered medications prior to visit.    PAST MEDICAL HISTORY: Past Medical History:  Diagnosis Date   AC (acromioclavicular) joint bone spurs    Arthritis    Autoimmune disease, not elsewhere classified(279.49) 08/04/2013   Lupus cutaneus,  With SLE type serology - and oligoclonal bands, negative brain MRI .    Bursitis    Chronic pain    Degenerative joint disease    Goiter, nontoxic, multinodular    High cholesterol    Hypertension    Lupus (HCC)    hx. subcutaneous lupus.   Multiple sclerosis (HCC)    Osteoarthritis    Osteoporosis    PONV (postoperative nausea and vomiting)    Scoliosis    Sjogren's disease (HCC) 09/26/2014   Skin cancer    Torn meniscus 11/26/2012   left knee    PAST SURGICAL HISTORY: Past Surgical History:  Procedure Laterality Date   childbirth  [id number]   admissions   DILATION AND CURETTAGE OF UTERUS     KNEE SURGERY Left 09-01-13   1'14   SKIN CANCER EXCISION     right nasal bridge   TONSILLECTOMY     TOTAL HIP ARTHROPLASTY Left 09/08/2013   Procedure: LEFT TOTAL HIP ARTHROPLASTY ANTERIOR APPROACH;  Surgeon: Shelda Pal, MD;  Location: [facility];  Service: Orthopedics;  Laterality: Left;    FAMILY HISTORY: Family History  Problem Relation Age of Onset   Heart disease Mother    Cancer Other     SOCIAL HISTORY: Social History   Socioeconomic History   Marital status: Divorced    Spouse name: Not on file   Number of children: 2   Years of education: Bachelor   Highest education level: Not on file  Occupational History   Occupation: plan  administer    Employer: [employer]    Comment: [employer]  Tobacco Use   Smoking status: Never   Smokeless tobacco: Never  Substance and Sexual Activity   Alcohol use: No   Drug use: No   Sexual activity: Yes  Other Topics Concern   Not on file  Social History Narrative   Patient is single with 2 children.   Patient is right handed   Patient has a Bachelor's degree.   Patient drinks 1 cup daily.   Social  Determinants of Health   Financial Resource Strain: Not on file  Food Insecurity: Not on file  Transportation Needs: Not on file  Physical Activity: Not on file  Stress: Not on file  Social Connections: Not on file  Intimate Partner Violence: Not on file      PHYSICAL EXAM  Vitals:   05/15/23 0931  BP: 125/75  Pulse: 82  Weight: 114 lb 12.8 oz (52.1 kg)  Height: 4\' 11"  (1.499 m)    Body mass index is 23.19 kg/m.  Generalized: Well developed, in no acute distress   Neurological examination  Mentation: Alert oriented to time, place, history taking. Follows all commands speech and language fluent Cranial nerve II-XII: Pupils were equal round reactive to light. Extraocular movements were full, visual field were full on confrontational test. Facial sensation and strength were normal.  Head turning and shoulder shrug  were normal and symmetric. Motor: The motor testing reveals 5 over 5 strength of all 4 extremities. Good symmetric motor tone is noted throughout.  Sensory: Sensory testing is intact to soft touch on all 4 extremities. No evidence of extinction is noted.  Coordination: Cerebellar testing reveals good finger-nose-finger and heel-to-shin bilaterally.  Gait and station: Patient has a stooped posture d/t scoliosis. Uses a cane when ambulating. Tandem gait not attempted. Reflexes: Deep tendon reflexes are symmetric and normal bilaterally.   DIAGNOSTIC DATA (LABS, IMAGING, TESTING) - I reviewed patient records, labs, notes, testing and imaging myself  where available.  Lab Results  Component Value Date   WBC 4.8 12/19/2020   HGB 10.5 (L) 12/19/2020   HCT 30.5 (L) 12/19/2020   MCV 93.6 12/19/2020   PLT 188 12/19/2020      Component Value Date/Time   NA 135 12/19/2020 2049   NA 137 05/31/2015 1309   K 3.6 12/19/2020 2049   K 3.9 05/31/2015 1309   CL 107 12/19/2020 2049   CO2 20 (L) 12/19/2020 2049   CO2 29 05/31/2015 1309   GLUCOSE 75 12/19/2020 2049   GLUCOSE 85 05/31/2015 1309   BUN 42 (H) 12/19/2020 2049   BUN 26.5 (H) 05/31/2015 1309   CREATININE 1.94 (H) 12/19/2020 2049   CREATININE 1.5 (H) 05/31/2015 1309   CALCIUM 7.9 (L) 12/19/2020 2049   CALCIUM 10.0 05/31/2015 1309   PROT 7.9 05/31/2015 1309   ALBUMIN 3.6 05/31/2015 1309   AST 30 05/31/2015 1309   ALT 15 05/31/2015 1309   ALKPHOS 98 05/31/2015 1309   BILITOT 0.34 05/31/2015 1309   GFRNONAA 27 (L) 12/19/2020 2049   GFRAA 55 (L) 08/11/2014 1425      ASSESSMENT AND PLAN 77 y.o. year old female  has a past medical history of AC (acromioclavicular) joint bone spurs, Arthritis, Autoimmune disease, not elsewhere classified(279.49) (08/04/2013), Bursitis, Chronic pain, Degenerative joint disease, Goiter, nontoxic, multinodular, High cholesterol, Hypertension, Lupus (HCC), Multiple sclerosis (HCC), Osteoarthritis, Osteoporosis, PONV (postoperative nausea and vomiting), Scoliosis, Sjogren's disease (HCC) (09/26/2014), Skin cancer, and Torn meniscus (11/26/2012). here with:  1.  Multiple sclerosis 2. Sjogren's disease 3.  Daytime sleepiness  -- Currently not on any disease modifying therapy -- Continue gabapentin 300 mg in the morning and 600 at bedtime -- Continue Provigil 200 mg daily -- Patient request that we not send any refills to the pharmacy until they request them -- Follow-up in 1 year or sooner if needed    Butch Penny, MSN, NP-C 05/15/2023, 9:37 AM [facility] [address] [place], [place] [address] [phone]

## 2023-05-21 ENCOUNTER — Other Ambulatory Visit (HOSPITAL_COMMUNITY): Payer: Self-pay

## 2023-05-21 NOTE — Telephone Encounter (Signed)
Pharmacy Patient Advocate Encounter  Prior Authorization for Provigil 200MG  Tablet has been APPROVED by [facility] from 05/07/2023 to 05/06/2024.   PA # [id number]  Copay is $45.00 per 30DS

## 2023-06-08 ENCOUNTER — Other Ambulatory Visit: Payer: Self-pay | Admitting: Adult Health

## 2023-07-11 ENCOUNTER — Other Ambulatory Visit: Payer: Self-pay | Admitting: Adult Health

## 2023-07-15 ENCOUNTER — Telehealth: Payer: Self-pay | Admitting: Adult Health

## 2023-07-15 MED ORDER — NEURONTIN 300 MG PO CAPS: ORAL_CAPSULE | ORAL | 3 refills | Status: DC

## 2023-07-15 NOTE — Telephone Encounter (Signed)
Pt has been without her NEURONTIN 300 MG capsule  since Saturday, she is asking that this be called in today for her.

## 2023-07-15 NOTE — Telephone Encounter (Signed)
 Refill request sent to pharmacy this am Pt aware

## 2023-07-16 MED ORDER — NEURONTIN 300 MG PO CAPS: ORAL_CAPSULE | ORAL | 3 refills | Status: DC

## 2023-07-16 NOTE — Telephone Encounter (Signed)
[facility] #[id number]  has asked that the script for the NEURONTIN 300 MG capsule be sent again, they did not get the one from yesterday

## 2023-07-16 NOTE — Addendum Note (Signed)
Addended by: Bertram Savin on: 07/16/2023 08:53 AM   Modules accepted: Orders

## 2023-07-16 NOTE — Telephone Encounter (Signed)
Rx sent again to [facility] # [phone]

## 2023-08-05 ENCOUNTER — Other Ambulatory Visit: Payer: Self-pay | Admitting: Adult Health

## 2023-08-05 DIAGNOSIS — M4120 Other idiopathic scoliosis, site unspecified: Secondary | ICD-10-CM

## 2023-08-07 ENCOUNTER — Telehealth: Payer: Self-pay | Admitting: Adult Health

## 2023-08-07 DIAGNOSIS — X58XXXA Exposure to other specified factors, initial encounter: Secondary | ICD-10-CM | POA: Diagnosis not present

## 2023-08-07 DIAGNOSIS — S60222A Contusion of left hand, initial encounter: Secondary | ICD-10-CM | POA: Diagnosis not present

## 2023-08-07 NOTE — Telephone Encounter (Signed)
Will work on refill today. I see the request in our Rx refill queue.

## 2023-08-07 NOTE — Telephone Encounter (Signed)
Pt is asking for the modafinil (PROVIGIL) 200 MG tablet [facility] [phone]  to be filled, she is aware the request was received on 9-9 she has not heard from anyone on the request

## 2023-08-07 NOTE — Telephone Encounter (Signed)
Last seen 05/15/23 Next visit 05/14/24  Last refills:

## 2023-11-07 DIAGNOSIS — I10 Essential (primary) hypertension: Secondary | ICD-10-CM | POA: Diagnosis not present

## 2023-11-07 DIAGNOSIS — Z79899 Other long term (current) drug therapy: Secondary | ICD-10-CM | POA: Diagnosis not present

## 2023-11-07 DIAGNOSIS — E440 Moderate protein-calorie malnutrition: Secondary | ICD-10-CM | POA: Diagnosis not present

## 2023-11-07 DIAGNOSIS — M810 Age-related osteoporosis without current pathological fracture: Secondary | ICD-10-CM | POA: Diagnosis not present

## 2023-11-07 DIAGNOSIS — D631 Anemia in chronic kidney disease: Secondary | ICD-10-CM | POA: Diagnosis not present

## 2023-11-07 DIAGNOSIS — D84821 Immunodeficiency due to drugs: Secondary | ICD-10-CM | POA: Diagnosis not present

## 2023-11-07 DIAGNOSIS — G35 Multiple sclerosis: Secondary | ICD-10-CM | POA: Diagnosis not present

## 2023-11-07 DIAGNOSIS — E7800 Pure hypercholesterolemia, unspecified: Secondary | ICD-10-CM | POA: Diagnosis not present

## 2023-11-07 DIAGNOSIS — N1832 Chronic kidney disease, stage 3b: Secondary | ICD-10-CM | POA: Diagnosis not present

## 2023-12-03 DIAGNOSIS — L930 Discoid lupus erythematosus: Secondary | ICD-10-CM | POA: Diagnosis not present

## 2023-12-03 DIAGNOSIS — L814 Other melanin hyperpigmentation: Secondary | ICD-10-CM | POA: Diagnosis not present

## 2023-12-03 DIAGNOSIS — M3500 Sicca syndrome, unspecified: Secondary | ICD-10-CM | POA: Diagnosis not present

## 2023-12-03 DIAGNOSIS — L821 Other seborrheic keratosis: Secondary | ICD-10-CM | POA: Diagnosis not present

## 2023-12-03 DIAGNOSIS — Z79899 Other long term (current) drug therapy: Secondary | ICD-10-CM | POA: Diagnosis not present

## 2023-12-03 DIAGNOSIS — D229 Melanocytic nevi, unspecified: Secondary | ICD-10-CM | POA: Diagnosis not present

## 2023-12-09 DIAGNOSIS — H04123 Dry eye syndrome of bilateral lacrimal glands: Secondary | ICD-10-CM | POA: Diagnosis not present

## 2023-12-09 DIAGNOSIS — H2513 Age-related nuclear cataract, bilateral: Secondary | ICD-10-CM | POA: Diagnosis not present

## 2024-01-02 ENCOUNTER — Telehealth: Payer: Self-pay | Admitting: Adult Health

## 2024-01-02 ENCOUNTER — Encounter: Payer: Self-pay | Admitting: Adult Health

## 2024-01-02 NOTE — Telephone Encounter (Signed)
 LVM and sent letter in mail informing pt of need to reschedule 05/14/24 appt - NP out

## 2024-01-07 DIAGNOSIS — H04123 Dry eye syndrome of bilateral lacrimal glands: Secondary | ICD-10-CM | POA: Diagnosis not present

## 2024-01-07 DIAGNOSIS — H2513 Age-related nuclear cataract, bilateral: Secondary | ICD-10-CM | POA: Diagnosis not present

## 2024-02-10 DIAGNOSIS — H2513 Age-related nuclear cataract, bilateral: Secondary | ICD-10-CM | POA: Diagnosis not present

## 2024-02-10 DIAGNOSIS — H04121 Dry eye syndrome of right lacrimal gland: Secondary | ICD-10-CM | POA: Diagnosis not present

## 2024-02-10 DIAGNOSIS — H04122 Dry eye syndrome of left lacrimal gland: Secondary | ICD-10-CM | POA: Diagnosis not present

## 2024-02-21 DIAGNOSIS — Z124 Encounter for screening for malignant neoplasm of cervix: Secondary | ICD-10-CM | POA: Diagnosis not present

## 2024-02-21 DIAGNOSIS — Z1231 Encounter for screening mammogram for malignant neoplasm of breast: Secondary | ICD-10-CM | POA: Diagnosis not present

## 2024-02-21 DIAGNOSIS — Z01419 Encounter for gynecological examination (general) (routine) without abnormal findings: Secondary | ICD-10-CM | POA: Diagnosis not present

## 2024-03-27 ENCOUNTER — Other Ambulatory Visit: Payer: Self-pay | Admitting: Adult Health

## 2024-03-27 DIAGNOSIS — M4120 Other idiopathic scoliosis, site unspecified: Secondary | ICD-10-CM

## 2024-03-30 DIAGNOSIS — H2513 Age-related nuclear cataract, bilateral: Secondary | ICD-10-CM | POA: Diagnosis not present

## 2024-03-30 DIAGNOSIS — H04123 Dry eye syndrome of bilateral lacrimal glands: Secondary | ICD-10-CM | POA: Diagnosis not present

## 2024-03-31 NOTE — Telephone Encounter (Signed)
 Last fill 01-11-2024 #30. Last seen 05-15-2023, next appt 04-28-2024.

## 2024-04-01 NOTE — Telephone Encounter (Signed)
 Pt is calling, checking on the status of this medication being called in for her, she reports that she will be out on Sunday

## 2024-04-26 NOTE — Progress Notes (Unsigned)
 PATIENT: Michele Walton DOB: 10/08/1946  REASON FOR VISIT: follow up HISTORY FROM: patient  No chief complaint on file.    HISTORY OF PRESENT ILLNESS: Today 04/26/24:  Kidney disease?  05/15/23: Michele Walton is a 78 y.o. female with a history of Sojourn's disease, scoliosis with sciatica, daytime sleepiness and multiple sclerosis. Returns today for follow-up.  Overall she reports that she has been doing well.  No new or worsening symptoms.  She remains on gabapentin  300 mg in the morning and 600 mg at night for nerve pain- primarily in the legs.   States that Provigil  continues to work well for her.  She returns today for an evaluation.   05/14/22: Michele Walton is a 78 year old female with a history of Sjogren's disease, scoliosis with sciatica, daytime sleepiness and multiple sclerosis.  She returns today for follow-up.  The patient is not on any disease modifying therapy.  She does take gabapentin  300 mg in the morning and 600 mg at night for nerve pain.  She denies any new symptoms.  Continues to take Provigil  200 mg daily for daytime sleepiness.  She returns today for an evaluation.  12/05/20: Michele Walton is a 78 year old female with a history of Sjogren's disease, scoliosis with sciatica, daytime sleepiness and multiple sclerosis.  She returns today for follow-up.  She continues on gabapentin  300 mg in the morning and 600 mg at night.  She reports that this is helpful for her nerve pain.  She denies any new symptoms related to multiple sclerosis.  No changes with her gait or balance.  She actually feels that her balance is better.  No changes with bowels or bladder.  She does state that she has cataracts.  Followed up with ophthalmology who stated that they were unable to remove her cataracts due to Sjogren's disease.  She plans to get a second opinion.  She denies any falls.  Continues to take Provigil  200 mg daily for daytime sleepiness.  Reports that she feels that the effects have  changed since the medicine was switched to generic.  Although she still feels it is manageable. She returns today for an evaluation.  HISTORY 11/30/19:   Michele Walton is a 78 year old female with a history of sjoegren's disease, scoliosis with sciatica, daytime sleepiness and multiple sclerosis.  She returns today for follow-up.  She is not on any disease modifying therapy. Denies any new symptoms.  No changes with the bowels or bladder.  No numbness or tingling.  Reports that her balance is better.  She denies any falls.  She does use a cane.  She uses gabapentin  for nerve pain.  She states that the nerve pain is "all over."  She takes 300 mg in the morning and 600 mg at night.  She continues to take modafinil  for daytime sleepiness.  Reports that it works well.  She returns today for an evaluation.  REVIEW OF SYSTEMS: Out of a complete 14 system review of symptoms, the patient complains only of the following symptoms, and all other reviewed systems are negative.  See HPI  ALLERGIES: Allergies  Allergen Reactions   Latex Rash    Peels her skin   Percocet [Oxycodone-Acetaminophen ] Nausea Only and Rash   Sulfa Antibiotics Nausea Only and Rash   Crestor [Rosuvastatin] Other (See Comments)    Leg weakness and cramping.    Lipitor [Atorvastatin] Other (See Comments)    Leg weakness and cramping.    Macrobid [Nitrofurantoin] Nausea  Only   Red Dye #40 (Allura Red) Hives   Xiidra [Lifitegrast] Other (See Comments)    Caused "eyes to bleed".   Tape Rash    Tears skin    HOME MEDICATIONS: Outpatient Medications Prior to Visit  Medication Sig Dispense Refill   CALCIUM PO Take 600 mg by mouth daily.     cholecalciferol (VITAMIN D) 1000 UNITS tablet Take 1,000 Units by mouth daily.     fesoterodine  (TOVIAZ ) 8 MG TB24 tablet Take 8 mg by mouth daily.     hydroxychloroquine (PLAQUENIL) 200 MG tablet Take by mouth.     loratadine (CLARITIN) 10 MG tablet Take 10 mg by mouth daily.     modafinil   (PROVIGIL ) 200 MG tablet TAKE 1 TABLET BY MOUTH EVERY DAY 30 tablet 5   Multiple Vitamin (MULTI-VITAMINS) TABS Take by mouth.     NEURONTIN  300 MG capsule TAKE ONE CAPSULE BY MOUTH EVERY MORNING AND 2 CAPSULES EVERY NIGHT AT BEDTIME. 270 capsule 3   No facility-administered medications prior to visit.    PAST MEDICAL HISTORY: Past Medical History:  Diagnosis Date   AC (acromioclavicular) joint bone spurs    Arthritis    Autoimmune disease, not elsewhere classified(279.49) 08/04/2013   Lupus cutaneus,  With SLE type serology - and oligoclonal bands, negative brain MRI .    Bursitis    Chronic pain    Degenerative joint disease    Goiter, nontoxic, multinodular    High cholesterol    Hypertension    Lupus (HCC)    hx. subcutaneous lupus.   Multiple sclerosis (HCC)    Osteoarthritis    Osteoporosis    PONV (postoperative nausea and vomiting)    Scoliosis    Sjogren's disease (HCC) 09/26/2014   Skin cancer    Torn meniscus 11/26/2012   left knee    PAST SURGICAL HISTORY: Past Surgical History:  Procedure Laterality Date   childbirth  [id number]   admissions   DILATION AND CURETTAGE OF UTERUS     KNEE SURGERY Left 09-01-13   1'14   SKIN CANCER EXCISION     right nasal bridge   TONSILLECTOMY     TOTAL HIP ARTHROPLASTY Left 09/08/2013   Procedure: LEFT TOTAL HIP ARTHROPLASTY ANTERIOR APPROACH;  Surgeon: Bevin Bucks, MD;  Location: [facility];  Service: Orthopedics;  Laterality: Left;    FAMILY HISTORY: Family History  Problem Relation Age of Onset   Heart disease Mother    Cancer Other     SOCIAL HISTORY: Social History   Socioeconomic History   Marital status: Divorced    Spouse name: Not on file   Number of children: 2   Years of education: Bachelor   Highest education level: Not on file  Occupational History   Occupation: plan administer    Employer: [employer]    Comment: [employer]  Tobacco Use   Smoking status: Never   Smokeless tobacco: Never   Substance and Sexual Activity   Alcohol use: No   Drug use: No   Sexual activity: Yes  Other Topics Concern   Not on file  Social History Narrative   Patient is single with 2 children.   Patient is right handed   Patient has a Bachelor's degree.   Patient drinks 1 cup daily.   Social Drivers of Corporate investment banker Strain: Not on file  Food Insecurity: Not on file  Transportation Needs: Not on file  Physical Activity: Not on file  Stress:  Not on file  Social Connections: Not on file  Intimate Partner Violence: Not on file      PHYSICAL EXAM  There were no vitals filed for this visit.   There is no height or weight on file to calculate BMI.  Generalized: Well developed, in no acute distress   Neurological examination  Mentation: Alert oriented to time, place, history taking. Follows all commands speech and language fluent Cranial nerve II-XII: Pupils were equal round reactive to light. Extraocular movements were full, visual field were full on confrontational test. Facial sensation and strength were normal.  Head turning and shoulder shrug  were normal and symmetric. Motor: The motor testing reveals 5 over 5 strength of all 4 extremities. Good symmetric motor tone is noted throughout.  Sensory: Sensory testing is intact to soft touch on all 4 extremities. No evidence of extinction is noted.  Coordination: Cerebellar testing reveals good finger-nose-finger and heel-to-shin bilaterally.  Gait and station: Patient has a stooped posture d/t scoliosis. Uses a cane when ambulating. Tandem gait not attempted. Reflexes: Deep tendon reflexes are symmetric and normal bilaterally.   DIAGNOSTIC DATA (LABS, IMAGING, TESTING) - I reviewed patient records, labs, notes, testing and imaging myself where available.  Lab Results  Component Value Date   WBC 4.8 12/19/2020   HGB 10.5 (L) 12/19/2020   HCT 30.5 (L) 12/19/2020   MCV 93.6 12/19/2020   PLT 188 12/19/2020       Component Value Date/Time   NA 135 12/19/2020 2049   NA 137 05/31/2015 1309   K 3.6 12/19/2020 2049   K 3.9 05/31/2015 1309   CL 107 12/19/2020 2049   CO2 20 (L) 12/19/2020 2049   CO2 29 05/31/2015 1309   GLUCOSE 75 12/19/2020 2049   GLUCOSE 85 05/31/2015 1309   BUN 42 (H) 12/19/2020 2049   BUN 26.5 (H) 05/31/2015 1309   CREATININE 1.94 (H) 12/19/2020 2049   CREATININE 1.5 (H) 05/31/2015 1309   CALCIUM 7.9 (L) 12/19/2020 2049   CALCIUM 10.0 05/31/2015 1309   PROT 7.9 05/31/2015 1309   ALBUMIN 3.6 05/31/2015 1309   AST 30 05/31/2015 1309   ALT 15 05/31/2015 1309   ALKPHOS 98 05/31/2015 1309   BILITOT 0.34 05/31/2015 1309   GFRNONAA 27 (L) 12/19/2020 2049   GFRAA 55 (L) 08/11/2014 1425      ASSESSMENT AND PLAN 78 y.o. year old female  has a past medical history of AC (acromioclavicular) joint bone spurs, Arthritis, Autoimmune disease, not elsewhere classified(279.49) (08/04/2013), Bursitis, Chronic pain, Degenerative joint disease, Goiter, nontoxic, multinodular, High cholesterol, Hypertension, Lupus (HCC), Multiple sclerosis (HCC), Osteoarthritis, Osteoporosis, PONV (postoperative nausea and vomiting), Scoliosis, Sjogren's disease (HCC) (09/26/2014), Skin cancer, and Torn meniscus (11/26/2012). here with:  1.  Multiple sclerosis 2. Sjogren's disease 3.  Daytime sleepiness  -- Currently not on any disease modifying therapy -- Continue gabapentin  300 mg in the morning and 600 at bedtime -- Continue Provigil  200 mg daily -- Patient request that we not send any refills to the pharmacy until they request them -- Follow-up in 1 year or sooner if needed    Clem Currier, MSN, NP-C 04/26/2024, 2:00 PM [facility] [address] [place], [place] [address] [phone]

## 2024-04-28 ENCOUNTER — Encounter: Payer: Self-pay | Admitting: Adult Health

## 2024-04-28 ENCOUNTER — Ambulatory Visit (INDEPENDENT_AMBULATORY_CARE_PROVIDER_SITE_OTHER): Payer: Medicare Other | Admitting: Adult Health

## 2024-04-28 VITALS — BP 137/79 | HR 81 | Ht 60.0 in | Wt 118.6 lb

## 2024-04-28 DIAGNOSIS — R400 Somnolence: Secondary | ICD-10-CM | POA: Diagnosis not present

## 2024-04-28 DIAGNOSIS — G35 Multiple sclerosis: Secondary | ICD-10-CM | POA: Diagnosis not present

## 2024-04-29 NOTE — Progress Notes (Signed)
 Agree with assessment and plan. CD

## 2024-05-14 ENCOUNTER — Ambulatory Visit: Payer: Medicare Other | Admitting: Adult Health

## 2024-05-15 DIAGNOSIS — I10 Essential (primary) hypertension: Secondary | ICD-10-CM | POA: Diagnosis not present

## 2024-05-15 DIAGNOSIS — Z0000 Encounter for general adult medical examination without abnormal findings: Secondary | ICD-10-CM | POA: Diagnosis not present

## 2024-05-15 DIAGNOSIS — Z79899 Other long term (current) drug therapy: Secondary | ICD-10-CM | POA: Diagnosis not present

## 2024-05-15 DIAGNOSIS — N1832 Chronic kidney disease, stage 3b: Secondary | ICD-10-CM | POA: Diagnosis not present

## 2024-05-15 DIAGNOSIS — E440 Moderate protein-calorie malnutrition: Secondary | ICD-10-CM | POA: Diagnosis not present

## 2024-05-15 DIAGNOSIS — E7800 Pure hypercholesterolemia, unspecified: Secondary | ICD-10-CM | POA: Diagnosis not present

## 2024-05-15 DIAGNOSIS — D631 Anemia in chronic kidney disease: Secondary | ICD-10-CM | POA: Diagnosis not present

## 2024-05-15 DIAGNOSIS — N3946 Mixed incontinence: Secondary | ICD-10-CM | POA: Diagnosis not present

## 2024-05-15 DIAGNOSIS — D638 Anemia in other chronic diseases classified elsewhere: Secondary | ICD-10-CM | POA: Diagnosis not present

## 2024-05-15 DIAGNOSIS — M3500 Sicca syndrome, unspecified: Secondary | ICD-10-CM | POA: Diagnosis not present

## 2024-05-15 DIAGNOSIS — M810 Age-related osteoporosis without current pathological fracture: Secondary | ICD-10-CM | POA: Diagnosis not present

## 2024-05-15 DIAGNOSIS — G35 Multiple sclerosis: Secondary | ICD-10-CM | POA: Diagnosis not present

## 2024-05-15 DIAGNOSIS — R5383 Other fatigue: Secondary | ICD-10-CM | POA: Diagnosis not present

## 2024-05-20 ENCOUNTER — Telehealth: Payer: Self-pay | Admitting: Pharmacist

## 2024-05-20 NOTE — Telephone Encounter (Signed)
 Melissa From [employer]  called just to update Pt  Pa was approved and it is effective as of today

## 2024-05-20 NOTE — Telephone Encounter (Signed)
 Pharmacy Patient Advocate Encounter  Received notification from [facility] that Prior Authorization for Modafinil   has been APPROVED from 05/20/24 to 05/20/25

## 2024-05-20 NOTE — Telephone Encounter (Signed)
 Pharmacy Patient Advocate Encounter   Received notification from Patient Pharmacy that prior authorization for Modafinil  200MG  tablets is required/requested.   Insurance verification completed.   The patient is insured through [facility] .   Per test claim: PA required; PA submitted to above mentioned insurance via CoverMyMeds Key/confirmation #/EOC [id number] Status is pending

## 2024-05-22 ENCOUNTER — Other Ambulatory Visit (HOSPITAL_COMMUNITY): Payer: Self-pay

## 2024-05-22 ENCOUNTER — Telehealth: Payer: Self-pay

## 2024-05-22 NOTE — Telephone Encounter (Addendum)
 Pharmacy Patient Advocate Encounter   Received notification from Fax that prior authorization for NEURONTON 300MG   is required/requested.   Insurance verification completed.   The patient is insured through [facility] .   Per test claim: PA required; PA submitted to above mentioned insurance via Fax Key/confirmation #/EOC N/A Status is pending  Faxed completed form along with clinicals to [facility] [phone]

## 2024-06-01 ENCOUNTER — Other Ambulatory Visit (HOSPITAL_COMMUNITY): Payer: Self-pay

## 2024-06-01 DIAGNOSIS — I10 Essential (primary) hypertension: Secondary | ICD-10-CM | POA: Diagnosis not present

## 2024-06-01 NOTE — Telephone Encounter (Signed)
 Pharmacy Patient Advocate Encounter  Received notification from [facility] that Prior Authorization for Neurontin  300mg  Capsules has been APPROVED from 05/22/2024 to 05/20/2025. Unable to obtain price due to refill too soon rejection, last fill date 04/07/2024 next available fill date7/21/2025   PA #/Case ID/Reference #: N/A

## 2024-06-03 DIAGNOSIS — L930 Discoid lupus erythematosus: Secondary | ICD-10-CM | POA: Diagnosis not present

## 2024-06-03 DIAGNOSIS — Z79899 Other long term (current) drug therapy: Secondary | ICD-10-CM | POA: Diagnosis not present

## 2024-06-15 DIAGNOSIS — H2513 Age-related nuclear cataract, bilateral: Secondary | ICD-10-CM | POA: Diagnosis not present

## 2024-06-15 DIAGNOSIS — H04123 Dry eye syndrome of bilateral lacrimal glands: Secondary | ICD-10-CM | POA: Diagnosis not present

## 2024-07-04 ENCOUNTER — Other Ambulatory Visit: Payer: Self-pay | Admitting: Adult Health

## 2024-07-15 DIAGNOSIS — R300 Dysuria: Secondary | ICD-10-CM | POA: Diagnosis not present

## 2024-08-18 DIAGNOSIS — H04123 Dry eye syndrome of bilateral lacrimal glands: Secondary | ICD-10-CM | POA: Diagnosis not present

## 2024-10-20 ENCOUNTER — Other Ambulatory Visit: Payer: Self-pay | Admitting: Adult Health

## 2024-10-20 DIAGNOSIS — M4120 Other idiopathic scoliosis, site unspecified: Secondary | ICD-10-CM

## 2024-10-21 NOTE — Telephone Encounter (Signed)
 Requested Prescriptions   Pending Prescriptions Disp Refills   modafinil  (PROVIGIL ) 200 MG tablet [Pharmacy Med Name: MODAFINIL  200MG  TABLETS] 30 tablet     Sig: TAKE 1 TABLET BY MOUTH EVERY DAY   LAST SEEN 04/28/24 NEXT APPT 10/29/24 Dispenses   Dispensed Days Supply Quantity Provider Pharmacy  MODAFINIL  200MG  TABLETS 08/26/2024 30 30 each Sherryl Bouchard, NP [facility] #...  MODAFINIL  200MG  TABLETS 07/23/2024 30 30 each Sherryl Bouchard, NP [facility] DR[facility]ORE #...  MODAFINIL  200MG  TABLETS 05/28/2024 30 30 each Sherryl Bouchard, NP [facility]ORE #...  MODAFINIL  200MG  TABLETS 04/01/2024 30 30 each Sherryl Bouchard, NP [facility] DR[facility]ORE #...  MODAFINIL  200MG  TABLETS 01/11/2024 30 30 each Sherryl Bouchard, NP [facility] DR[facility] STORE #...  MODAFINIL  200MG  TABLETS 11/11/2023 30 30 each Sherryl Bouchard, NP [facility] STORE #.[email or web address]

## 2024-10-29 ENCOUNTER — Ambulatory Visit: Admitting: Neurology

## 2024-10-29 ENCOUNTER — Encounter: Payer: Self-pay | Admitting: Neurology

## 2024-10-29 VITALS — BP 135/78 | HR 97 | Ht 61.0 in | Wt 117.6 lb

## 2024-10-29 DIAGNOSIS — M4802 Spinal stenosis, cervical region: Secondary | ICD-10-CM

## 2024-10-29 DIAGNOSIS — M4120 Other idiopathic scoliosis, site unspecified: Secondary | ICD-10-CM | POA: Diagnosis not present

## 2024-10-29 DIAGNOSIS — G35D Multiple sclerosis, unspecified: Secondary | ICD-10-CM | POA: Diagnosis not present

## 2024-10-29 NOTE — Patient Instructions (Signed)
 78 y.o. year old female  here with:     1) Longstanding MS condition- dx  at [facility] in 1995, and followed here by Dr Maurice at the time  , never has used immuno-modifying medications.    2) Sjgren disease  was dx later , in the early 2000, also followed by Dr Maurice.      here for symptom treatment with gabapentin  and modafinil , but no refills at this time needed.       Plan : Yearly follow up , not for MS ( not needed ) only for symptomatic relief medications. She could continue getting these meds form PCP (!).     I would like to thank Duwaine Russell, NP and Chrystal Lamarr RAMAN, Md [address] [place],  [place] [address] for allowing me to meet with this pleasant patient.

## 2024-10-29 NOTE — Progress Notes (Signed)
 Provider:  Dedra Gores, MD    Primary Care Physician:  Chrystal Lamarr RAMAN, MD [address] [place] [address]     Referring Provider: Chrystal Lamarr RAMAN, Md [address] [place],  [place] [address]          Chief Complaint according to patient   Patient presents with:                HISTORY OF PRESENT ILLNESS:  Michele Walton is a 78 y.o. female patient who is here for revisit 10/29/2024 for MS - she has never been on medications,  she tried only  gabapentin  for nerve pain and modafinil  for fatigue/ sleepiness .  She no longer is in urological care. She has develop some bladder urge incontinence and walks with a 4 prong cane.  On Vasoteridine per PCP .  f/u pt states no questions or concerns for todays visit .    04/28/24:   Michele Walton is a 78 y.o. female with a history of Sjogren's disease, scoliosis with sciatica, daytime sleepiness and multiple sclerosis.. Returns today for follow-up.  She reports overall she has been doing well.  Denies any new symptoms.  The patient has never been on any disease modifying therapy.  Nor does she want to try any.  For that reason would not been repeating imaging.  She did just recently had MRI of the cervical spine without contrast.  I reviewed in epic.  Patient was seeing rheumatology for sjogren's disease but reports that she stopped seeing them as she did not find the visit is beneficial.  She denies any new symptoms.  Remains on gabapentin  300 mg in the morning and 600 mg at night for nerve related pain in the legs.  She states that this is working well.  Tolerating the medication well with no side effects.  Remains on Provigil  during the day and that has been extremely helpful.     05/15/23: Michele Walton is a 78 y.o. female with a history of Sojourn's disease, scoliosis with sciatica, daytime sleepiness and multiple sclerosis. Returns today for follow-up.  Overall she reports that she has  been doing well.  No new or worsening symptoms.  She remains on gabapentin  300 mg in the morning and 600 mg at night for nerve pain- primarily in the legs.   States that Provigil  continues to work well for her.  She returns today for an evaluation.     05/14/22: Michele Walton is a 78 year old female with a history of Sjogren's disease, scoliosis with sciatica, daytime sleepiness and multiple sclerosis.  She returns today for follow-up.  The patient is not on any disease modifying therapy.  She does take gabapentin  300 mg in the morning and 600 mg at night for nerve pain.  She denies any new symptoms.  Continues to take Provigil  200 mg daily for daytime sleepiness.  She returns today for an evaluation.  Social HX; see previous note       Review of Systems: Out of a complete 14 system review, the patient complains of only the following symptoms, and all other reviewed systems are negative.:   SLEEPINESS ?  How likely are you to doze in the following situations: 0 = not likely, 1 = slight chance, 2 = moderate chance, 3 = high chance  Sitting and Reading? Watching Television? Sitting inactive in a public place (theater or meeting)? Lying down in the  afternoon when circumstances permit? Sitting and talking to someone? Sitting quietly after lunch without alcohol? In a car, while stopped for a few minutes in traffic? As a passenger in a car for an hour without a break?  Total =  2/ 24     Sleeping well.   FSS at 33/ 63 points on modafinil   No daytime naps needed.        Social History   Socioeconomic History   Marital status: Divorced    Spouse name: Not on file   Number of children: 2   Years of education: Bachelor   Highest education level: Not on file  Occupational History   Occupation: plan administer    Employer: [employer]    Comment: [employer]  Tobacco Use   Smoking status: Never   Smokeless tobacco: Never  Vaping Use   Vaping status: Not on file   Substance and Sexual Activity   Alcohol use: No   Drug use: No   Sexual activity: Yes  Other Topics Concern   Not on file  Social History Narrative   Patient is single with 2 children    Patient is right handed   Patient has a Bachelor's degree.   Patient drinks 1 cup daily.   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    Bursitis    Chronic pain    Degenerative joint disease    Goiter, nontoxic, multinodular    High cholesterol    Hypertension    Lupus    hx. subcutaneous lupus.   Multiple sclerosis    Osteoarthritis    Osteoporosis    PONV (postoperative nausea and vomiting)    Scoliosis    Sjogren's disease 09/26/2014   Skin cancer    Torn meniscus 11/26/2012   left knee    Past Surgical History:  Procedure Laterality Date   childbirth  8027,8025   admissions   DILATION AND CURETTAGE OF UTERUS     KNEE SURGERY Left 09-01-13   1'14   SKIN CANCER EXCISION     right nasal bridge   TONSILLECTOMY     TOTAL HIP ARTHROPLASTY Left 09/08/2013   Procedure: LEFT TOTAL HIP ARTHROPLASTY ANTERIOR APPROACH;  Surgeon: Donnice JONETTA Car, MD;  Location: [facility];  Service: Orthopedics;  Laterality: Left;     Current Outpatient Medications on File Prior to Visit  Medication Sig Dispense Refill   benazepril (LOTENSIN) 5 MG tablet Take 5 mg by mouth daily.     CALCIUM PO Take 600 mg by mouth daily.      cholecalciferol (VITAMIN D) 1000 UNITS tablet Take 1,000 Units by mouth daily.     fesoterodine  (TOVIAZ ) 8 MG TB24 tablet Take 8 mg by mouth daily.     hydroxychloroquine (PLAQUENIL) 200 MG tablet Take  by mouth.     loratadine (CLARITIN) 10 MG tablet Take 10 mg by mouth daily.     modafinil  (PROVIGIL ) 200 MG tablet TAKE 1 TABLET BY MOUTH EVERY DAY 30 tablet 5   Multiple Vitamin (MULTI-VITAMINS) TABS Take by mouth.     NEURONTIN  300 MG capsule TAKE 1 CAPSULE BY MOUTH EVERY MORNING AND 2 CAPSULES EVERY NIGHT AT BEDTIME 270 capsule 3   No current facility-administered medications on file prior to visit.    Allergies  Allergen Reactions   Latex Rash    Peels her skin   Percocet [Oxycodone-Acetaminophen ] Nausea Only and Rash   Sulfa Antibiotics Nausea Only and Rash   Crestor [Rosuvastatin] Other (See Comments)    Leg weakness and cramping.    Lipitor [Atorvastatin] Other (See Comments)    Leg weakness and cramping.    Macrobid [Nitrofurantoin] Nausea Only   Red Dye #40 (Allura Red) Hives   Xiidra [Lifitegrast] Other (See Comments)    Caused eyes to bleed.   Tape Rash    Tears skin     DIAGNOSTIC DATA (LABS, IMAGING, TESTING) - I reviewed patient records, labs, notes, testing and imaging myself where available.  Lab Results  Component Value Date   WBC 4.8 12/19/2020   HGB 10.5 (L) 12/19/2020   HCT 30.5 (L) 12/19/2020   MCV 93.6 12/19/2020   PLT 188 12/19/2020      Component Value Date/Time   NA 135 12/19/2020 2049   NA 137 05/31/2015 1309   K 3.6 12/19/2020 2049   K 3.9 05/31/2015 1309   CL 107 12/19/2020 2049   CO2 20 (L) 12/19/2020 2049   CO2 29 05/31/2015 1309   GLUCOSE 75 12/19/2020 2049   GLUCOSE 85 05/31/2015 1309   BUN 42 (H) 12/19/2020 2049   BUN 26.5 (H) 05/31/2015 1309   CREATININE 1.94 (H) 12/19/2020 2049   CREATININE 1.5 (H) 05/31/2015 1309   CALCIUM 7.9 (L) 12/19/2020 2049   CALCIUM 10.0 05/31/2015 1309   PROT 7.9 05/31/2015 1309   ALBUMIN 3.6  05/31/2015 1309   AST 30 05/31/2015 1309   ALT 15 05/31/2015 1309   ALKPHOS 98 05/31/2015 1309   BILITOT 0.34 05/31/2015 1309   GFRNONAA 27 (L) 12/19/2020 2049   GFRAA 55 (L) 08/11/2014 1425   No results found for: CHOL, HDL, LDLCALC, LDLDIRECT, TRIG, CHOLHDL No results found for: YHAJ8R No results found for: VITAMINB12 No results found for: TSH  PHYSICAL EXAM:  Vitals:   10/29/24 1325  BP: 135/78  Pulse: 97   No data found. Body mass index is 22.22 kg/m.   Wt Readings from Last 3 Encounters:  10/29/24 117 lb 9.6 oz (53.3 kg)  04/28/24 118 lb 9.6 oz (53.8 kg)  05/15/23 114 lb 12.8 oz (52.1 kg)     Ht Readings from Last 3 Encounters:  10/29/24 5' 1 (1.549 m)  04/28/24 5' (1.524 m)  05/15/23 4' 11 (1.499 m)      General: The patient is awake, alert and appears not in acute distress and groomed. Head: Normocephalic, atraumatic.  Neck is supple. Mallampati 2 - dark red and dry tongue. ,  neck circumference:13 inches .   Nasal airflow  patent.   Retrognathia is  seen.  Dental status:  crowded lower jaw . Cardiovascular:  Regular rate and cardiac rhythm by pulse, without distended neck veins. Respiratory: no shortness of breath  Skin:  Without evidence of ankle edema, or rash. Trunk: BMI is 22.22  NEUROLOGIC EXAM: Alert oriented to time, place, history taking. Follows all commands speech and language fluent Cranial nerve II-XII: Pupils were equal round reactive to light.  Dry eyes, reddened eye lids.  Extraocular movements were full, visual field were full on confrontational test.  Facial sensation and strength were normal.   Left jaw joint  tighter then right, slight facial asymmetry.   Head turning and and flexion and extension was in normal ROM.   shoulder shrug was asymmetric.-   left shoulder lower then right -not related to scoliosis ?   Motor: strength of all upper  extremities with symmetric motor tone is noted throughout.  Left  leg hip flexion is weaker since hip replacement,  not due to fracture but  to wear and tear of hip joint.   Sensory: Sensory testing is intact to soft touch on all 4 extremities. No evidence of extinction is noted.  Coordination: Cerebellar testing reveals good finger-nose-finger and heel-to-shin bilaterally.  Gait and station: Patient has a stooped posture d/t scoliosis.  Uses a  43 prong cane when ambulating.  Tandem gait not attempted. Reflexes: Reflexes: Normal and symmetric throughout. No ankle clonus.  Babinski's sign is absent bilaterally.      ASSESSMENT AND PLAN :   78 y.o. year old female  here with:    1) Longstanding MS condition- dx  at [facility] in 1995, and followed here by Dr Maurice at the time  , never has used immuno-modifying medications.   2) Sjgren disease  was dx later , in the early 2000, also followed by Dr Maurice.     here for symptom treatment with gabapentin  and modafinil , but no refills at this time needed.     Plan : Yearly follow up , not for MS ( not needed ) only for symptomatic relief medications. She could continue getting these meds form PCP (!).   I would like to thank Duwaine Russell, NP and Chrystal Lamarr RAMAN, Md [address] [place],  [place] [address] for allowing me to meet with this pleasant patient.     Any patient with sleepiness should be cautioned not to drive, work at heights, or operate dangerous or heavy equipment when feeling tired or sleepy. The patient will be seen in follow-up in the sleep clinic at [facility] for discussion of test results, sleep related symptoms and treatment compliance review, further management strategies, etc.   The referring provider will be notified of the test results.   The patient's condition requires frequent monitoring and adjustments in the treatment plan, reflecting the ongoing complexity of care.  This provider is the continuing focal point for all needed services for this condition.  After spending a  total time of  35  minutes face to face and time for  history taking, physical and neurologic examination, review of laboratory studies,  personal review of imaging studies, reports and results of other testing and review of referral information / records as far as provided in visit,   Electronically signed by: Dedra Gores, MD 10/29/2024 2:00 PM  [facility] Board certified by The [employer] of Sleep Medicine and Diplomate of the [employer] of Sleep Medicine. Board certified In Neurology through the ABPN, Fellow of the [employer] of Neu[employer].

## 2025-09-15 ENCOUNTER — Ambulatory Visit: Admitting: Adult Health
